# Patient Record
Sex: Female | Born: 1950 | Race: White | Hispanic: No | Marital: Married | Smoking: Former smoker
Health system: Southern US, Community
[De-identification: ages and names within clinical notes are randomized; demographics above are authoritative.]

## PROBLEM LIST (undated history)

## (undated) DIAGNOSIS — J189 Pneumonia, unspecified organism: Secondary | ICD-10-CM

## (undated) DIAGNOSIS — H8109 Meniere's disease, unspecified ear: Secondary | ICD-10-CM

## (undated) DIAGNOSIS — C801 Malignant (primary) neoplasm, unspecified: Secondary | ICD-10-CM

## (undated) DIAGNOSIS — E7800 Pure hypercholesterolemia, unspecified: Secondary | ICD-10-CM

## (undated) DIAGNOSIS — J45909 Unspecified asthma, uncomplicated: Secondary | ICD-10-CM

## (undated) DIAGNOSIS — I10 Essential (primary) hypertension: Secondary | ICD-10-CM

## (undated) DIAGNOSIS — Z96651 Presence of right artificial knee joint: Secondary | ICD-10-CM

## (undated) DIAGNOSIS — M1990 Unspecified osteoarthritis, unspecified site: Secondary | ICD-10-CM

## (undated) DIAGNOSIS — E039 Hypothyroidism, unspecified: Secondary | ICD-10-CM

## (undated) DIAGNOSIS — I6529 Occlusion and stenosis of unspecified carotid artery: Secondary | ICD-10-CM

## (undated) DIAGNOSIS — I739 Peripheral vascular disease, unspecified: Secondary | ICD-10-CM

## (undated) HISTORY — PX: COLONOSCOPY: SHX174

## (undated) HISTORY — DX: Unspecified asthma, uncomplicated: J45.909

## (undated) HISTORY — DX: Hypothyroidism, unspecified: E03.9

## (undated) HISTORY — DX: Essential (primary) hypertension: I10

## (undated) HISTORY — DX: Unspecified osteoarthritis, unspecified site: M19.90

## (undated) HISTORY — DX: Pure hypercholesterolemia, unspecified: E78.00

---
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
## 1980-01-27 HISTORY — PX: APPENDECTOMY: SHX54

## 1981-01-26 HISTORY — PX: TUBAL LIGATION: SHX77

## 1996-01-27 HISTORY — PX: SIGMOID RESECTION / RECTOPEXY: SUR1294

## 1998-01-26 HISTORY — PX: VAGINAL HYSTERECTOMY: SUR661

## 2004-07-16 ENCOUNTER — Ambulatory Visit: Payer: Self-pay | Admitting: Obstetrics and Gynecology

## 2005-09-08 ENCOUNTER — Ambulatory Visit: Payer: Self-pay | Admitting: Obstetrics and Gynecology

## 2006-05-06 ENCOUNTER — Ambulatory Visit: Payer: Self-pay | Admitting: Family Medicine

## 2006-12-16 ENCOUNTER — Ambulatory Visit: Payer: Self-pay | Admitting: Family Medicine

## 2007-12-20 ENCOUNTER — Ambulatory Visit: Payer: Self-pay | Admitting: Family Medicine

## 2008-10-18 ENCOUNTER — Emergency Department: Payer: Self-pay | Admitting: Emergency Medicine

## 2009-08-01 ENCOUNTER — Ambulatory Visit: Payer: Self-pay | Admitting: Unknown Physician Specialty

## 2009-08-02 LAB — PATHOLOGY REPORT

## 2010-08-27 ENCOUNTER — Ambulatory Visit: Payer: Self-pay | Admitting: Family Medicine

## 2010-09-21 ENCOUNTER — Ambulatory Visit: Payer: Self-pay | Admitting: Family Medicine

## 2011-09-09 ENCOUNTER — Ambulatory Visit: Payer: Self-pay | Admitting: Family Medicine

## 2012-10-25 ENCOUNTER — Ambulatory Visit: Payer: Self-pay | Admitting: Family Medicine

## 2012-10-27 ENCOUNTER — Emergency Department: Payer: Self-pay | Admitting: Emergency Medicine

## 2012-10-27 LAB — COMPREHENSIVE METABOLIC PANEL
Albumin: 3.9 g/dL (ref 3.4–5.0)
Anion Gap: 10 (ref 7–16)
BUN: 20 mg/dL — ABNORMAL HIGH (ref 7–18)
Bilirubin,Total: 0.3 mg/dL (ref 0.2–1.0)
Chloride: 107 mmol/L (ref 98–107)
Co2: 22 mmol/L (ref 21–32)
Creatinine: 0.7 mg/dL (ref 0.60–1.30)
EGFR (African American): >60
EGFR (Non-African Amer.): >60
Glucose: 99 mg/dL (ref 65–99)
Potassium: 3.6 mmol/L (ref 3.5–5.1)
Sodium: 139 mmol/L (ref 136–145)
Total Protein: 7.3 g/dL (ref 6.4–8.2)

## 2012-10-27 LAB — PROTIME-INR: INR: 0.9

## 2012-10-27 LAB — CBC
HCT: 40.3 % (ref 35.0–47.0)
MCH: 30 pg (ref 26.0–34.0)
MCHC: 34.4 g/dL (ref 32.0–36.0)
MCV: 87 fL (ref 80–100)
Platelet: 258 10*3/uL (ref 150–440)
RBC: 4.62 10*6/uL (ref 3.80–5.20)
WBC: 7.6 10*3/uL (ref 3.6–11.0)

## 2012-10-27 LAB — CK TOTAL AND CKMB (NOT AT ARMC): CK-MB: 1 ng/mL (ref 0.5–3.6)

## 2012-10-27 LAB — TSH: Thyroid Stimulating Horm: 5.04 u[IU]/mL — ABNORMAL HIGH

## 2012-10-27 LAB — APTT: Activated PTT: 35.5 secs (ref 23.6–35.9)

## 2012-10-27 LAB — T4, FREE: Free Thyroxine: 1.1 ng/dL (ref 0.76–1.46)

## 2013-11-15 DIAGNOSIS — J4520 Mild intermittent asthma, uncomplicated: Secondary | ICD-10-CM | POA: Insufficient documentation

## 2013-11-15 DIAGNOSIS — J4531 Mild persistent asthma with (acute) exacerbation: Secondary | ICD-10-CM | POA: Insufficient documentation

## 2013-11-22 ENCOUNTER — Ambulatory Visit: Payer: Self-pay | Admitting: Family Medicine

## 2015-04-03 ENCOUNTER — Other Ambulatory Visit: Payer: Self-pay | Admitting: Family Medicine

## 2015-04-03 DIAGNOSIS — Z1231 Encounter for screening mammogram for malignant neoplasm of breast: Secondary | ICD-10-CM

## 2015-04-17 ENCOUNTER — Ambulatory Visit
Admission: RE | Admit: 2015-04-17 | Discharge: 2015-04-17 | Disposition: A | Discharge status: Home / Self Care | Payer: BC Managed Care – PPO | Source: Ambulatory Visit | Attending: Family Medicine | Admitting: Family Medicine

## 2015-04-17 DIAGNOSIS — Z1231 Encounter for screening mammogram for malignant neoplasm of breast: Secondary | ICD-10-CM | POA: Insufficient documentation

## 2016-03-20 ENCOUNTER — Other Ambulatory Visit: Payer: Self-pay | Admitting: Family Medicine

## 2016-03-20 DIAGNOSIS — Z1231 Encounter for screening mammogram for malignant neoplasm of breast: Secondary | ICD-10-CM

## 2016-04-17 ENCOUNTER — Ambulatory Visit: Payer: BC Managed Care – PPO

## 2016-05-08 ENCOUNTER — Ambulatory Visit
Admission: RE | Admit: 2016-05-08 | Discharge: 2016-05-08 | Disposition: A | Discharge status: Home / Self Care | Payer: BC Managed Care – PPO | Source: Ambulatory Visit | Attending: Family Medicine | Admitting: Family Medicine

## 2016-05-08 DIAGNOSIS — Z1231 Encounter for screening mammogram for malignant neoplasm of breast: Secondary | ICD-10-CM

## 2016-08-30 DIAGNOSIS — M1711 Unilateral primary osteoarthritis, right knee: Secondary | ICD-10-CM | POA: Insufficient documentation

## 2017-05-03 ENCOUNTER — Other Ambulatory Visit: Payer: Self-pay | Admitting: Family Medicine

## 2017-05-03 DIAGNOSIS — Z1231 Encounter for screening mammogram for malignant neoplasm of breast: Secondary | ICD-10-CM

## 2017-05-19 ENCOUNTER — Ambulatory Visit: Payer: Self-pay | Admitting: Urology

## 2017-05-26 ENCOUNTER — Ambulatory Visit
Admission: RE | Admit: 2017-05-26 | Discharge: 2017-05-26 | Disposition: A | Discharge status: Home / Self Care | Payer: BC Managed Care – PPO | Source: Ambulatory Visit | Attending: Family Medicine | Admitting: Family Medicine

## 2017-05-26 DIAGNOSIS — Z1231 Encounter for screening mammogram for malignant neoplasm of breast: Secondary | ICD-10-CM

## 2017-05-28 ENCOUNTER — Other Ambulatory Visit: Payer: Self-pay | Admitting: Family Medicine

## 2017-05-28 DIAGNOSIS — R928 Other abnormal and inconclusive findings on diagnostic imaging of breast: Secondary | ICD-10-CM

## 2017-05-28 DIAGNOSIS — N6320 Unspecified lump in the left breast, unspecified quadrant: Secondary | ICD-10-CM

## 2017-05-31 ENCOUNTER — Ambulatory Visit: Payer: Self-pay | Admitting: Urology

## 2017-06-08 ENCOUNTER — Ambulatory Visit
Admission: RE | Admit: 2017-06-08 | Discharge: 2017-06-08 | Disposition: A | Discharge status: Home / Self Care | Payer: BC Managed Care – PPO | Source: Ambulatory Visit | Attending: Family Medicine | Admitting: Family Medicine

## 2017-06-08 DIAGNOSIS — N6320 Unspecified lump in the left breast, unspecified quadrant: Secondary | ICD-10-CM

## 2017-06-08 DIAGNOSIS — R928 Other abnormal and inconclusive findings on diagnostic imaging of breast: Secondary | ICD-10-CM | POA: Insufficient documentation

## 2017-06-22 DIAGNOSIS — E039 Hypothyroidism, unspecified: Secondary | ICD-10-CM | POA: Insufficient documentation

## 2017-06-22 DIAGNOSIS — I10 Essential (primary) hypertension: Secondary | ICD-10-CM | POA: Insufficient documentation

## 2017-06-22 DIAGNOSIS — E782 Mixed hyperlipidemia: Secondary | ICD-10-CM | POA: Insufficient documentation

## 2017-06-22 DIAGNOSIS — J309 Allergic rhinitis, unspecified: Secondary | ICD-10-CM | POA: Insufficient documentation

## 2017-06-22 NOTE — Progress Notes (Addendum)
06/23/2017 4:05 PM   Lori Kidd 1950/08/14 [id number]  Referring provider: Dion Body, MD [place] [facility] [place], [place] [address]  Chief Complaint  Patient presents with  . Recurrent UTI    HPI: Patient is a 67 -year-old Caucasian female who is referred to [place] by, Dr. Netty Starring, for recurrent urinary tract infections.  Patient states that she has had three urinary tract infections over the last year.   Reviewing her records,  she has had four documented UTI's over the nine months.       Her symptoms with a urinary tract infection consist of "that particular pain" with UTI's and happens quickly with low grade fevers.  She had blood in the urine with her first infection.    She denies current dysuria, gross hematuria, suprapubic pain, back pain, abdominal pain or flank pain.      She states she had a "bladder tacking" with her hysterectomy in 2000 with Dr. Ouida Sills.  She may have had a spontaneous passage of a stone approximately ten years ago.  She states she felt a sharp intense pain in the right lower quadrant that caused her to vomit.  She states she went to the ED and was told she had sand in her kidneys.  She denies any trauma to her bladder or kidneys.     Patient states that she also had a sigmoid resection back in 1998 which resulted in a lot of scar tissue.  When the hysterectomy was performed, the right adnexa was unable to be removed due to this tissue.  She is sexually active.  She has noted a correlation with her urinary tract infections and sexual intercourse.   She does not engage in anal sex.  She is voiding before and after sex.   She is postmenopausal.  She has a sister with breast cancer.    She denies constipation and/or diarrhea.  She does engage in good perineal hygiene. She does take tub baths.   She has incontinence.  Both SUI and UI.  She is using light incontinence pads.  She started wearing the  incontinence pads approximately 9 months ago feels there is a correlation between the wearing of the incontinence pads and recurrent urinary tract infections.  She is not having pain with bladder filling.    She has not had any recent imaging studies.    She is drinking "right much" of water daily.   Rare soda intake,  No juices.  Unsweetened teas.  3 to 4 drinks of alcohol a week.  Her PVR today is 0 mL.  She was a social smoker in college.    Her baseline urinary symptoms consist of urinary frequency resulting in visiting the restroom every hour while home.  She states that this is been going on for a while.  She also has an urgency to urinate at times.  Sometimes the urge is intense and causes urinary leakage.  She is also getting up 1-2 times a night.  She also states that she experiences the urge to urinate after she brushes her teeth at night.  Reviewed referral notes - see urine cultures in lab section  PMH: Past Medical History:  Diagnosis Date  . Arthritis   . Asthma   . High blood pressure   . High cholesterol   . Hypothyroidism     Surgical History: Past Surgical History:  Procedure Laterality Date  . APPENDECTOMY  1982  . SIGMOID RESECTION / RECTOPEXY  [place]  . VAGINAL HYSTERECTOMY  2000    Home Medications:  Allergies as of 06/23/2017      Reactions   Ace Inhibitors Cough   Penicillins Rash   Redness      Medication List        Accurate as of 06/23/17  4:05 PM. Always use your most recent med list.          aspirin EC 81 MG tablet Take 81 mg by mouth daily.   CLARITIN 10 MG Caps Generic drug:  Loratadine Take 1 capsule by mouth daily.   levothyroxine 112 MCG tablet Commonly known as:  SYNTHROID, LEVOTHROID Take 1 tablet by mouth daily.   naproxen sodium 220 MG tablet Commonly known as:  ALEVE Take 220 mg by mouth.   Omega-3 1000 MG Caps Take 2 capsules by mouth daily.   pravastatin 40 MG tablet Commonly known as:   PRAVACHOL Take 1 tablet by mouth daily.   triamterene-hydrochlorothiazide 37.5-25 MG tablet Commonly known as:  MAXZIDE-25 Take 1 tablet by mouth daily.   TYLENOL 8 HOUR PO Take 1 capsule by mouth daily.       Allergies:  Allergies  Allergen Reactions  . Ace Inhibitors Cough  . Penicillins Rash    Redness    Family History: Family History  Problem Relation Age of Onset  . Breast cancer Sister        early 6's  . Pneumonia Mother   . Pneumonia Father     Social History:  has no tobacco, alcohol, and drug history on file.  ROS: UROLOGY Frequent Urination?: Yes Hard to postpone urination?: Yes Burning/pain with urination?: No Get up at night to urinate?: Yes Leakage of urine?: Yes Urine stream starts and stops?: No Trouble starting stream?: No Do you have to strain to urinate?: No Blood in urine?: No Urinary tract infection?: Yes Sexually transmitted disease?: No Injury to kidneys or bladder?: No Painful intercourse?: No Weak stream?: No Currently pregnant?: No Vaginal bleeding?: No Last menstrual period?: n  Gastrointestinal Nausea?: No Vomiting?: No Indigestion/heartburn?: No Diarrhea?: No Constipation?: No  Constitutional Fever: No Night sweats?: Yes Weight loss?: No Fatigue?: Yes  Skin Skin rash/lesions?: No Itching?: No  Eyes Blurred vision?: No Double vision?: No  Ears/Nose/Throat Sore throat?: No Sinus problems?: No  Hematologic/Lymphatic Swollen glands?: No Easy bruising?: Yes  Cardiovascular Leg swelling?: No Chest pain?: No  Respiratory Cough?: Yes Shortness of breath?: No  Endocrine Excessive thirst?: No  Musculoskeletal Back pain?: No Joint pain?: Yes  Neurological Headaches?: No Dizziness?: No  Psychologic Depression?: No Anxiety?: No  Physical Exam: BP 123/80   Pulse 81   Ht 5' 3.5" (1.613 m)   Wt 177 lb 1.6 oz (80.3 kg)   BMI 30.88 kg/m   Constitutional:  Well nourished. Alert and oriented, No  acute distress. HEENT: [place] AT, moist mucus membranes.  Trachea midline, no masses. Cardiovascular: No clubbing, cyanosis, or edema. Respiratory: Normal respiratory effort, no increased work of breathing. GI: Abdomen is soft, non tender, non distended, no abdominal masses. Liver and spleen not palpable.  No hernias appreciated.  Stool sample for occult testing is not indicated.   GU: No CVA tenderness.  No bladder fullness or masses.  Atrophic external genitalia, normal pubic hair distribution, no lesions.  Normal urethral meatus, no lesions, no prolapse, no discharge.   No urethral masses, tenderness and/or tenderness. No bladder fullness, tenderness or masses. Pale vagina mucosa, poor estrogen effect, no  discharge, no lesions, poor pelvic support, no cystocele noted.  Rectocele noted.  Cervix and uterus are surgically absent.  Left adnexa is surgically absent.  Right adnexa is tender.   Anus and perineum are without rashes or lesions.    Skin: No rashes, bruises or suspicious lesions. Lymph: No cervical or inguinal adenopathy. Neurologic: Grossly intact, no focal deficits, moving all 4 extremities. Psychiatric: Normal mood and affect.  Laboratory Data: Lab Results  Component Value Date   WBC 7.6 10/27/2012   HGB 13.9 10/27/2012   HCT 40.3 10/27/2012   MCV 87 10/27/2012   PLT 258 10/27/2012    Lab Results  Component Value Date   CREATININE 0.70 10/27/2012    No results found for: PSA  No results found for: TESTOSTERONE  No results found for: HGBA1C  Lab Results  Component Value Date   TSH 5.04 (H) 10/27/2012    No results found for: CHOL, HDL, CHOLHDL, VLDL, LDLCALC  Lab Results  Component Value Date   AST 20 10/27/2012   Lab Results  Component Value Date   ALT 25 10/27/2012   No components found for: ALKALINEPHOPHATASE No components found for: BILIRUBINTOTAL  No results found for: ESTRADIOL  Urinalysis No results found for: COLORURINE, APPEARANCEUR, LABSPEC,  PHURINE, GLUCOSEU, HGBUR, BILIRUBINUR, KETONESUR, PROTEINUR, UROBILINOGEN, NITRITE, LEUKOCYTESUR  Urine cultures Pan-sensitive E. Coli on 07/02/2016 Pan-sensitive E. Coli on 07/18/2016 Pan-sensitive E. Coli on 12/02/2016 Ampicillin resistant E. Coli on 04/29/2017  I have reviewed the labs.   I have independently reviewed the films.    Pertinent Imaging Results for Lori Kidd, Lori Kidd (MRN [id number]) as of 06/23/2017 10:15  Ref. Range 06/23/2017 10:06  Scan Result Unknown 0    Assessment & Plan:    1. Recurrent UTI's  - criteria for recurrent UTI has been met with 2 or more infections in 6 months or 3 or greater infections in one year   - patient has good water intake  - patient is instructed to take probiotics (yogurt, oral pills or vaginal suppositories), take cranberry pills or drink the juice and Vitamin C 1,000 mg daily to acidify the urine   - avoid soaking in tubs and wipe front to back after urinating   - benefit from core strengthening exercises has been seen.  We can refer to PT if they desire -she would like a referral to PT  -Asked the patient to contact us for symptoms of urinary tract infections  2. Vaginal atrophy Discussed the role of vaginal estrogen creams in the preventing of recurrent urinary tract infections and while the risk is minimal, patient would defer vaginal estrogen cream at this time due to her sister's history of breast cancer and her history of an abnormal mammogram -May consider this treatment option in the future if conservative measures are not effective in controlling recurrent UTIs  3. Right adnexal tenderness Offered patient referred to gynecology or for [place] to obtain a pelvic ultrasound, she would like [place] to order a pelvic ultrasound and be referred based on those findings                                         4.  Pelvic floor relaxation Explained the role of pelvic muscles and their contribution to stability and how undergoing pelvic  surgery or childbirth can weaken these muscles Offered patient a referral to physical therapy for further evaluation and  strengthening of the pelvic floor RTC once completes PT  5. Urgency Discussed OAB agents and their side effect profiles, patient will like to defer any medical treatment at this time until she completes her physical therapy Will reassess when she returns in 6 weeks   Return in about 6 weeks (around 08/04/2017) for follow up after PT.  These notes generated with voice recognition software. I apologize for typographical errors.  Zara Council, PA-C  [facility] [address]  [place], [place] [address] [phone]

## 2017-06-23 ENCOUNTER — Encounter: Payer: Self-pay | Admitting: Urology

## 2017-06-23 ENCOUNTER — Ambulatory Visit: Payer: BC Managed Care – PPO | Admitting: Urology

## 2017-06-23 VITALS — BP 123/80 | HR 81 | Ht 63.5 in | Wt 177.1 lb

## 2017-06-23 DIAGNOSIS — R3915 Urgency of urination: Secondary | ICD-10-CM

## 2017-06-23 DIAGNOSIS — N390 Urinary tract infection, site not specified: Secondary | ICD-10-CM

## 2017-06-23 DIAGNOSIS — R102 Pelvic and perineal pain: Secondary | ICD-10-CM | POA: Diagnosis not present

## 2017-06-23 DIAGNOSIS — N8189 Other female genital prolapse: Secondary | ICD-10-CM | POA: Diagnosis not present

## 2017-06-23 DIAGNOSIS — N952 Postmenopausal atrophic vaginitis: Secondary | ICD-10-CM

## 2017-06-23 LAB — BLADDER SCAN AMB NON-IMAGING: SCAN RESULT: 0

## 2017-06-23 NOTE — Patient Instructions (Signed)

## 2017-06-30 ENCOUNTER — Ambulatory Visit: Payer: BC Managed Care – PPO | Admitting: Physical Therapy

## 2017-07-01 ENCOUNTER — Ambulatory Visit: Payer: BC Managed Care – PPO

## 2017-07-01 ENCOUNTER — Ambulatory Visit
Admission: RE | Admit: 2017-07-01 | Discharge: 2017-07-01 | Disposition: A | Discharge status: Home / Self Care | Payer: BC Managed Care – PPO | Source: Ambulatory Visit | Attending: Urology | Admitting: Urology

## 2017-07-01 DIAGNOSIS — R102 Pelvic and perineal pain: Secondary | ICD-10-CM

## 2017-07-02 ENCOUNTER — Telehealth: Payer: Self-pay

## 2017-07-02 NOTE — Telephone Encounter (Signed)
-----   Message from Harle BattiestShannon A McGowan, PA-C sent at 07/02/2017  7:37 AM EDT ----- Please let Mrs. Perlie GoldRussell know that her pelvic ultrasound was normal.

## 2017-07-02 NOTE — Telephone Encounter (Signed)
Patient notified

## 2017-07-07 ENCOUNTER — Ambulatory Visit: Payer: BC Managed Care – PPO | Attending: Urology | Admitting: Physical Therapy

## 2017-07-07 ENCOUNTER — Encounter: Payer: BC Managed Care – PPO | Admitting: Physical Therapy

## 2017-07-07 ENCOUNTER — Encounter: Payer: Self-pay | Admitting: Physical Therapy

## 2017-07-07 ENCOUNTER — Other Ambulatory Visit: Payer: Self-pay

## 2017-07-07 DIAGNOSIS — R29898 Other symptoms and signs involving the musculoskeletal system: Secondary | ICD-10-CM | POA: Insufficient documentation

## 2017-07-07 DIAGNOSIS — R278 Other lack of coordination: Secondary | ICD-10-CM | POA: Insufficient documentation

## 2017-07-07 DIAGNOSIS — M2170 Unequal limb length (acquired), unspecified site: Secondary | ICD-10-CM

## 2017-07-07 DIAGNOSIS — M791 Myalgia, unspecified site: Secondary | ICD-10-CM

## 2017-07-07 NOTE — Patient Instructions (Addendum)
  Avoid straining pelvic floor, abdominal muscles , spine  Use log rolling technique instead of getting out of bed with your neck or the sit-up   Log rolling out of .bed  L  arm overhead  Raise hips and scoot hips to R   Drop knees to L,  scooting L shoulder back to get completely on your L side so your shoulders, hips, and knees point to the L    Then breathe as you drop feet off bed and prop onto L elbow and  use both hands to push yourself      __________  Wear lift in shoe on R.  Wear a normal shoe with good sole support ( avoid flipflops)   ___________  6 spinal directions  Arm swings,  side bend Minisquat with wide feet, chest lifts   6 reps   X 3 day   ___  Where as this one is a non latex one shoe lift https://www.theinsolestore.com/clearly-adjustable-heel-lift.html

## 2017-07-08 NOTE — Therapy (Signed)
[place]  [facility] [address] [place],[facility] [place], [address] Phone: [phone]   Fax:  [phone]  Physical Therapy Evaluation  Patient Details  Name: Lori HighmanJennie Jenkins Kidd MRN: [id number] Date of Birth: 12/20/1950 Referring Provider: Michiel CowboyShannon McGowan   Encounter Date: 07/07/2017  PT End of Session - 07/07/17 1415    Visit Number  1    Number of Visits  12    Date for PT Re-Evaluation  09/29/17    PT Start Time  1305    PT Stop Time  1403    PT Time Calculation (min)  58 min       Past Medical History:  Diagnosis Date  . Arthritis   . Asthma   . High blood pressure   . High cholesterol   . Hypothyroidism     Past Surgical History:  Procedure Laterality Date  . APPENDECTOMY  1982  . SIGMOID RESECTION / RECTOPEXY  1998  . TONSILLECTOMY  1962  . TUBAL LIGATION  1983  . VAGINAL HYSTERECTOMY  2000    There were no vitals filed for this visit.   Subjective Assessment - 07/08/17 1937    Subjective  1) Pt has experienced urinary leakage in her 30s. Within the past 2 years, leakage became hard to control. Pt is wearing 2 pads per day and she is not able to hike with family , travel for > 1 hours, nor shop without needing to get to a bathroom .  Pt also has leakage with coughing, sneezing, laughing.  Hx of asthma for 15 years and coughing makes leakage worse.    2) Urge  and frequency: Trips to the bathroom  every hours 1 x during day,  2 x night . First trip at night to the toilet occurs at the same time every night and the second time occurs 1 hours later due urge.  Pt has the urge to go to the bathroom once she arriave home with the key in the door. Pt sometimes has leakage before making it to the bathroom.  Pt reports she has not had a sleep study to screen for OSA . Pt snores.   3) difficulty with controlling gas. Denied bowel incontinence.   Denied LBP, sexual dysfunction. Pt currently has R knee pain and will be  undergoing TKA 2020.  Physical activities: Crunches on the machine at the gym, lifting 18 lbs granddtr. Pt cares for her granddtr 5 x week.  Pt enjoys yoga.    4) R knee pain:  Currently pt stopped walking, hiking 6-7 miles, shopping, stairclimbing  due to the R knee OA. Pt reports she has scoliosis and she didnot know about it until her first child.    5) Recurrent UTI: Pt had 3 x across 1 year . Pt had to stop going to the pool which she loved for strengthening her body due to UTI.  Pt currently does not have a UTI.       Pertinent History   Pt had a hysterectomy 15 years ago to remove benign tumors on her B ovaries but the left ovary was removed because there was increased scar adhesions.  Pt had sigmoid removed in 1999 ( 20 years ago) and tubal ligation ( 1324( 1983).  Pt recently had a US done on the R ovary and findings were negative.  Additional contributing factors: Hx of asthma for 15 years and coughing makes leakage worse.  Hx of HBP.  2 vaginal deliveries, epsiotomies. 10  lb babies. use of forceps in delivery.  Scoliosis             [facility] PT Assessment - 07/08/17 1937      Assessment   Medical Diagnosis  pelvic floor relaxation    Referring Provider  Michiel Cowboy      Precautions   Precautions  None      Restrictions   Weight Bearing Restrictions  No      Prior Function   Level of Independence  Independent      Observation/Other Assessments   Observations  L shoulder lower than R  medial malleoli-AIIS86 cm R, 87 cm L, R iliac crest higher,       Posture/Postural Control   Posture/Postural Control  -- limited diaphragmatic excursion      AROM   Overall AROM Comments  sidebend  , sidebend R 53 cm digitt II to floor  ( pre shoe lift) and 51 cm (post-shoe lift) , 51 cm L  sidebend      Palpation   Palpation comment  signficant scar restrictions longitinal scar pubic to umbilicus       Bed Mobility   Bed Mobility  -- crunch method                Objective  measurements completed on examination: See above findings.    Pelvic Floor Special Questions - 07/08/17 1937    Pelvic Floor Internal Exam  plan to assess at upcoming sessions       [facility] Adult PT Treatment/Exercise - 07/08/17 1936      Bed Mobility   Bed Mobility  -- crunch method      Posture/Postural Control   Posture/Postural Control  -- limited diaphragmatic excursion                  PT Long Term Goals - 07/07/17 1315      PT LONG TERM GOAL #1   Title  Pt will be able to shop in store for to 1 hours without needing to get to the toilet     Time  12    Period  Weeks    Status  New    [employer] Date  09/29/17      PT LONG TERM GOAL #2   Title  Pt will decrease her trips to the toilet from 1x/ hr to 1x/ every 2 hours in order to work     Time  8    Period  Weeks    Status  New    [employer] Date  09/01/17      PT LONG TERM GOAL #3   Title  Pt will decrease her PFDI score from 68% to <50 % in order to restore pelvic function    Time  12    Period  Weeks    Status  New    [employer] Date  09/29/17      PT LONG TERM GOAL #4   Title  Pt will decrease pdas from 2x day to < 1 x day in order to QOL     Time  6    Period  Weeks    Status  New    [employer] Date  08/18/17      PT LONG TERM GOAL #5   Title  Pt will demo proper body mechanics to minimzie strain on her pelvic floor and optimize her intraabdominal pressure system in order to perform ADLs and lift her granddtr    Time  4    Period  Weeks    Status  New    [employer] Date  08/04/17      Additional Long Term Goals   Additional Long Term Goals  Yes      PT LONG TERM GOAL #6   Title  Pt will report no recurrent UTI across 3 months in order to improve QOL    Time  12    Period  Weeks    Status  New    [employer] Date  09/30/17             Plan - 07/08/17 1937    Clinical Impression Statement  Pt is a 67 yo female who complains of  pelvic dysfunctions that include stress and urge urinary  incontinence,  urgency, frequency, flatulence, recurrent UTIs, and nocturia. Pt also reported R knee  pain. These deficits impact her QOL and ADLs. Pt's clinical presentation  includes leg length discrepency 2/2 scoliosis, limited spinal ROM, gait deviations,  severe scar restrictions over abdomen, dyscoodination of pelvic and deep  core mm, and poor body mechanics which place load on her deep core system.  Following Tx today, pt showed improved body mechanics. R shoe lift was  provided which in turn lead to a more equally aligned iliac crest crest, increased R spinal side flexion ROM, gait, and  decreased pain in R knee. Plan to decrease abdominal scar restrictions  with manual Tx, assess pelvic floor, and customize Tx to minimize worsening of scoliosis to help pt meet her goals.  Pt would benefit from an regional interdependent approach to yield longer lasting benefits.  Pt would also benefit from a sleep study to screen for OSA given her risk factors of OSA (nocturia, report of snoring, respiratory disorder -asthma). Pt was provided research articles on the association between nocturia and OSA.        History and Personal Factors relevant to plan of care:  Surgeries over abdomen. Pt had a hysterectomy 15 years ago to remove benign tumors on her B ovaries but the left ovary was removed because there was increased scar adhesions.  Pt had sigmoid removed in 1999 ( 20 years ago) and tubal ligation ( 6045).  Pt recently had a US done on the R ovary and findings were negative.  Additional contributing factors: Hx of asthma for 15 years and coughing makes leakage worse.  Hx o f HBP.  2 vaginal deliveries, epsiotomies. 10 lb babies. use of forceps in delivery.  Scoliosis    Clinical Presentation  Evolving    Clinical Decision Making  High    Rehab Potential  Good    PT Frequency  1x / week    PT Duration  12 weeks    PT Treatment/Interventions  Therapeutic exercise;Electrical Stimulation;Neuromuscular  re-education;Moist Heat;Manual techniques;Therapeutic activities;Patient/family education;Aquatic Therapy;Stair training;Gait training;Scar mobilization    Consulted and Agree with Plan of Care  Patient       Patient will benefit from skilled therapeutic intervention in order to improve the following deficits and impairments:  Improper body mechanics, Pain, Abnormal gait, Decreased coordination, Decreased mobility, Decreased scar mobility, Postural dysfunction, Increased muscle spasms, Decreased endurance, Decreased strength, Decreased balance, Impaired flexibility, Decreased range of motion, Increased fascial restricitons  Visit Diagnosis: Other symptoms and signs involving the musculoskeletal system  Myalgia  Other lack of coordination  Unequal leg length     Problem List Patient Active Problem List   Diagnosis Date Noted  . Acquired hypothyroidism 06/22/2017  . Allergic  rhinitis 06/22/2017  . Essential hypertension 06/22/2017  . Mixed hyperlipidemia 06/22/2017  . Primary osteoarthritis of right knee 08/30/2016  . Mild persistent asthma with acute exacerbation 11/15/2013    Lori Kidd ,PT, DPT, E-RYT  07/08/2017, 7:51 PM  [place] [facility] SERVICES [address] [place], [place], [address] Phone: [phone]   Fax:  [phone]  Name: Lori Kidd MRN: [id number] Date of Birth: 1950/04/10

## 2017-07-14 ENCOUNTER — Encounter: Payer: BC Managed Care – PPO | Admitting: Physical Therapy

## 2017-07-14 ENCOUNTER — Ambulatory Visit: Payer: BC Managed Care – PPO | Admitting: Physical Therapy

## 2017-07-14 ENCOUNTER — Encounter: Payer: Self-pay | Admitting: Physical Therapy

## 2017-07-14 DIAGNOSIS — M2170 Unequal limb length (acquired), unspecified site: Secondary | ICD-10-CM

## 2017-07-14 DIAGNOSIS — R29898 Other symptoms and signs involving the musculoskeletal system: Secondary | ICD-10-CM | POA: Diagnosis not present

## 2017-07-14 DIAGNOSIS — M791 Myalgia, unspecified site: Secondary | ICD-10-CM

## 2017-07-14 DIAGNOSIS — R278 Other lack of coordination: Secondary | ICD-10-CM

## 2017-07-14 NOTE — Therapy (Signed)
[place] [facility] MAIN [facility] SERVICES [address] [place], [place], [address] Phone: [phone]   Fax:  [phone]  Physical Therapy Treatment  Patient Details  Name: Lori Kidd MRN: [id number] Date of Birth: 11-07-1950 Referring Provider: Michiel Cowboy   Encounter Date: 07/14/2017  PT End of Session - 07/14/17 2339    Visit Number  2    Number of Visits  12    Date for PT Re-Evaluation  09/29/17    PT Start Time  1115    PT Stop Time  1216    PT Time Calculation (min)  61 min    Activity Tolerance  Patient tolerated treatment well;No increased pain    Behavior During Therapy  WFL for tasks assessed/performed       Past Medical History:  Diagnosis Date  . Arthritis   . Asthma   . High blood pressure   . High cholesterol   . Hypothyroidism     Past Surgical History:  Procedure Laterality Date  . APPENDECTOMY  1982  . SIGMOID RESECTION / RECTOPEXY  1998  . TONSILLECTOMY  1962  . TUBAL LIGATION  1983  . VAGINAL HYSTERECTOMY  2000    There were no vitals filed for this visit.  Subjective Assessment - 07/14/17 1118    Subjective  Pt reported she will be ordering a vinyl shoe lift. She feels better overall with some positive change. She feels more balanced with picking up the baby.     Pertinent History   Pt had a hysterectomy 15 years ago to remove benign tumors on her B ovaries but the left ovary was removed because there was increased scar adhesions.  Pt had sigmoid removed in 1999 ( 20 years ago) and tubal ligation ( 6962).  Pt recently had a US done on the R ovary and findings were negative.  Additional contributing factors: Hx of asthma for 15 years and coughing makes leakage worse.  Hx of HBP.  2 vaginal deliveries, epsiotomies. 10 lb babies. use of forceps in delivery.  Scoliosis             [facility] PT Assessment - 07/14/17 2331      Floor to Stand   Comments  with poor alignment at knees and narrow BOS, with  breathholding       Other:   Other/ Comments  simulated lifting of granddtr , poor form                 Pelvic Floor Special Questions - 07/14/17 2332    Pelvic Floor Internal Exam  pt consented verbally without contraindications     Exam Type  Vaginal    Palpation  increased scar restrictions at 5-7 o clock, obt int L, and R ischiocavernosus/ behind pubic symphysis     Strength  fair squeeze, definite lift 3rd layer initially, post Tx: more sequential activation     Strength # of reps  8    Strength # of seconds  1        [facility] Adult PT Treatment/Exercise - 07/14/17 2335      Therapeutic Activites    Therapeutic Activities  -- see pt instructions.modified floor<> stand t/f due to [facility]       Neuro Re-ed    Neuro Re-ed Details   see pt instructions       Manual Therapy   Internal Pelvic Floor  fascial release at probelm areas indicated in assessment  PT Long Term Goals - 07/07/17 1315      PT LONG TERM GOAL #1   Title  Pt will be able to shop in store for to 1 hours without needing to get to the toilet     Time  12    Period  Weeks    Status  New    [employer] Date  09/29/17      PT LONG TERM GOAL #2   Title  Pt will decrease her trips to the toilet from 1x/ hr to 1x/ every 2 hours in order to work     Time  8    Period  Weeks    Status  New    [employer] Date  09/01/17      PT LONG TERM GOAL #3   Title  Pt will decrease her PFDI score from 68% to <50 % in order to restore pelvic function    Time  12    Period  Weeks    Status  New    [employer] Date  09/29/17      PT LONG TERM GOAL #4   Title  Pt will decrease pdas from 2x day to < 1 x day in order to QOL     Time  6    Period  Weeks    Status  New    [employer] Date  08/18/17      PT LONG TERM GOAL #5   Title  Pt will demo proper body mechanics to minimzie strain on her pelvic floor and optimize her intraabdominal pressure system in order to perform ADLs and lift her granddtr     Time  4    Period  Weeks    Status  New    Target Date  08/04/17      Additional Long Term Goals   Additional Long Term Goals  Yes      PT LONG TERM GOAL #6   Title  Pt will report no recurrent UTI across 3 months in order to improve QOL    Time  12    Period  Weeks    Status  New    Target Date  09/30/17            Plan - 07/14/17 2339    Clinical Impression Statement  Pt required training and modifications to floor <> stand t/f and lifting granddtr.  Pt demo'd correctly after receiving cues for better alignment and less straining of pelvic floor and minimize pain / strain on R knee 2/2 OA.  Pt's intervaginal assessment showed scar restrictions. Pt tolerated manual Tx without complaints and dmeo'd improved pelvic floor coodination and strength post Tx. Plan to apply manual Tx to decrease abdominal scar next session.  Pt continues to benefit from skilled PT     Rehab Potential  Good    PT Frequency  1x / week    PT Duration  12 weeks    PT Treatment/Interventions  Therapeutic exercise;Electrical Stimulation;Neuromuscular re-education;Moist Heat;Manual techniques;Therapeutic activities;Patient/family education;Aquatic Therapy;Stair training;Gait training;Scar mobilization    Consulted and Agree with Plan of Care  Patient       Patient will benefit from skilled therapeutic intervention in order to improve the following deficits and impairments:  Improper body mechanics, Pain, Abnormal gait, Decreased coordination, Decreased mobility, Decreased scar mobility, Postural dysfunction, Increased muscle spasms, Decreased endurance, Decreased strength, Decreased balance, Impaired flexibility, Decreased range of motion, Increased fascial restricitons  Visit Diagnosis: Myalgia  Other lack of coordination  Unequal leg length  Other symptoms and signs involving the musculoskeletal system     Problem List Patient Active Problem List   Diagnosis Date Noted  . Acquired  hypothyroidism 06/22/2017  . Allergic rhinitis 06/22/2017  . Essential hypertension 06/22/2017  . Mixed hyperlipidemia 06/22/2017  . Primary osteoarthritis of right knee 08/30/2016  . Mild persistent asthma with acute exacerbation 11/15/2013    Mariane MastersYeung,Shin Yiing ,PT, DPT, E-RYT  07/14/2017, 11:43 PM  [place] [facility] [address] [place], [place], [address] Phone: [phone]   Fax:  [phone]  Name: Lori Kidd MRN: [id number] Date of Birth: 08/19/1950

## 2017-07-14 NOTE — Patient Instructions (Signed)
       Transition from standing to floor: Wide squat like you are about to pick something up from the floor --> hands on the ground (all fours)  To get up, all fours--> lifts hips in to Downward Facing Dog  and walk hands backwards to feet --> mini quat --> hands on thighs, then hips then pause to avoid (moving too quickly up/ blood rush) -->  knees glide forward and roll  Hips up instead of hinging spine up   See videos on phone   __ Lifting granddtr with   Minisquat: Scoot buttocks back slight, hinge like you are looking at your reflection on a pond  Knees behind toes,  Inhale to "smell flowers"  Exhale on the rise "like rocket"  Do not lock knees, have more weight across ballmounds of feet, toes relaxed   10 reps x 3 x day   ___  Deep core Level 1 10 reps,  Quick squeeze 10 reps  X 2 day

## 2017-07-21 ENCOUNTER — Encounter: Payer: BC Managed Care – PPO | Admitting: Physical Therapy

## 2017-07-21 ENCOUNTER — Ambulatory Visit: Payer: BC Managed Care – PPO | Admitting: Physical Therapy

## 2017-07-21 DIAGNOSIS — M2170 Unequal limb length (acquired), unspecified site: Secondary | ICD-10-CM

## 2017-07-21 DIAGNOSIS — R278 Other lack of coordination: Secondary | ICD-10-CM

## 2017-07-21 DIAGNOSIS — R29898 Other symptoms and signs involving the musculoskeletal system: Secondary | ICD-10-CM

## 2017-07-21 DIAGNOSIS — M791 Myalgia, unspecified site: Secondary | ICD-10-CM

## 2017-07-21 NOTE — Therapy (Signed)
[place] [facility] [address] [place], [place], [address] Phone: [phone]   Fax:  [phone]  Physical Therapy Treatment  Patient Details  Name: Lori HighmanJennie Jenkins Kidd MRN: [id number] Date of Birth: 10/21/1950 Referring Provider: Michiel CowboyShannon McGowan   Encounter Date: 07/21/2017  PT End of Session - 07/21/17 1152    Visit Number  3    Number of Visits  12    Date for PT Re-Evaluation  09/29/17    PT Start Time  1105    PT Stop Time  1158    PT Time Calculation (min)  53 min    Activity Tolerance  Patient tolerated treatment well;No increased pain    Behavior During Therapy  WFL for tasks assessed/performed       Past Medical History:  Diagnosis Date  . Arthritis   . Asthma   . High blood pressure   . High cholesterol   . Hypothyroidism     Past Surgical History:  Procedure Laterality Date  . APPENDECTOMY  1982  . SIGMOID RESECTION / RECTOPEXY  1998  . TONSILLECTOMY  1962  . TUBAL LIGATION  1983  . VAGINAL HYSTERECTOMY  2000    There were no vitals filed for this visit.  Subjective Assessment - 07/21/17 1141    Subjective  Pt reports she tends to go to the bathroom every 30 min. Pt was sore in the pelvic area for 3 days after last session. Pt is noticing her pelvic floor and using the lifting techniques when carrying her granddtr.     Pertinent History   Pt had a hysterectomy 15 years ago to remove benign tumors on her B ovaries but the left ovary was removed because there was increased scar adhesions.  Pt had sigmoid removed in 1999 ( 20 years ago) and tubal ligation ( 3244( 1983).  Pt recently had a US done on the R ovary and findings were negative.  Additional contributing factors: Hx of asthma for 15 years and coughing makes leakage worse.  Hx of HBP.  2 vaginal deliveries, epsiotomies. 10 lb babies. use of forceps in delivery.  Scoliosis             [facility] PT Assessment - 07/21/17 1153      Coordination   Gross  Motor Movements are Fluid and Coordinated  -- minor cue for lessl umbopelvic instability      Ambulation/Gait   Gait Comments  less limping                     [facility] Adult PT Treatment/Exercise - 07/21/17 1152      Neuro Re-ed    Neuro Re-ed Details   see pt instructions              PT Education - 07/21/17 1151    Education Details  HEP    Person(s) Educated  Patient    Methods  Explanation;Demonstration;Tactile cues;Verbal cues;Handout    Comprehension  Verbalized understanding;Returned demonstration          PT Long Term Goals - 07/07/17 1315      PT LONG TERM GOAL #1   Title  Pt will be able to shop in store for 45min to 1 hours without needing to get to the toilet     Time  12    Period  Weeks    Status  New    [employer] Date  09/29/17      PT LONG TERM  GOAL #2   Title  Pt will decrease her trips to the toilet from 1x/ hr to 1x/ every 2 hours in order to work     Time  8    Period  Weeks    Status  New    Target Date  09/01/17      PT LONG TERM GOAL #3   Title  Pt will decrease her PFDI score from 68% to <50 % in order to restore pelvic function    Time  12    Period  Weeks    Status  New    Target Date  09/29/17      PT LONG TERM GOAL #4   Title  Pt will decrease pdas from 2x day to < 1 x day in order to QOL     Time  6    Period  Weeks    Status  New    Target Date  08/18/17      PT LONG TERM GOAL #5   Title  Pt will demo proper body mechanics to minimzie strain on her pelvic floor and optimize her intraabdominal pressure system in order to perform ADLs and lift her granddtr    Time  4    Period  Weeks    Status  New    Target Date  08/04/17      Additional Long Term Goals   Additional Long Term Goals  Yes      PT LONG TERM GOAL #6   Title  Pt will report no recurrent UTI across 3 months in order to improve QOL    Time  12    Period  Weeks    Status  New    Target Date  09/30/17            Plan - 07/21/17 1158     Clinical Impression Statement Pt showed good carry over with pelvic floor coordination and improved gait with shoe lift in place. Today, pt progressed to deep core level 2 with minor cues for more postural stability. Added urge supression and mindfulness training. 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 Pt voiced understanding.  Pt was also provided pain science education in preparation for her knee surgery. Pt continues to benefit from skilled PT. Plan to add yoga to her HEP with modifications for R knee OA as pt reports she enjoyed yoga in the past.      Rehab Potential  Good    PT Frequency  1x / week    PT Duration  12 weeks    PT Treatment/Interventions  Therapeutic exercise;Electrical Stimulation;Neuromuscular re-education;Moist Heat;Manual techniques;Therapeutic activities;Patient/family education;Aquatic Therapy;Stair training;Gait training;Scar mobilization    Consulted and Agree with Plan of Care  Patient       Patient will benefit from skilled therapeutic intervention in order to improve the following deficits and impairments:  Improper body mechanics, Pain, Abnormal gait, Decreased coordination, Decreased mobility, Decreased scar mobility, Postural dysfunction, Increased muscle spasms, Decreased endurance, Decreased strength, Decreased balance, Impaired flexibility, Decreased range of motion, Increased fascial restricitons  Visit Diagnosis: Myalgia  Other lack of coordination  Unequal leg length  Other symptoms and signs involving the musculoskeletal system     Problem List Patient Active Problem List   Diagnosis Date Noted  . Acquired hypothyroidism 06/22/2017  . Allergic rhinitis 06/22/2017  . Essential hypertension 06/22/2017  .  Mixed hyperlipidemia 06/22/2017  . Primary osteoarthritis of right knee 08/30/2016  . Mild persistent asthma with acute  exacerbation 11/15/2013    Mariane Masters ,PT, DPT, E-RYT  07/21/2017, 12:04 PM  [facility] MAIN [facility] SERVICES [address] [place], [place], [address] Phone: [phone]   Fax:  [phone]  Name: Lori Kidd MRN: [id number] Date of Birth: 1950-08-28

## 2017-07-21 NOTE — Patient Instructions (Addendum)
In the morning and night: Deep core level 1 and 2   Also add in the morning: Body scan ( mindfulness )  ___________________ When you feel the urge after you have just emptied your bladder: ( remember normal schedule is 1x every 2 hours)  URGE SUPPRESION TECHNIQUES  ? These techniques are to be used to suppress those abnormally strong URGES to urinate, especially after you have already urinated < 1hr ago.  ? These steps do not have to be followed in order, and not all steps have to be used.   ? The purpose of these steps is to help you regain control of your bladder, to reduce the amount of urinary urgency, frequency, or leaking.  ? They take practice to master in controlling urgency.  Allow yourself to be okay with leaking when you first start practicing these steps. ? Practice these steps first at home, when you do not have to worry as much about leaking.  1. SIT DOWN.  Pressure on the pelvic floor inhibits the bladder.  Further pressure may help, such as sitting on a small rolled up towel. 2. LIGHT "KEGELS" using elevator imagery.  Breathe in, feel pelvic floor lower to "basement" level by the end of the inhalation. Exhale, feel pelvic floor gradually move up to "1st floor" which is the neutral position of pelvic floor. At the end of exhalation, feel pelvic floor lift higher at which you will perform a quick light squeeze (the muscles that hold back gas and urine).  Do not do hard, maximal contractions, as this will quickly fatigue the muscles and cause leakage. Perform this 5 repetitions in this pattern.  3. BREATHE & STAY CALM.  Breathing slowly and remaining calm will inhibit your sympathetic nervous system, which will in turn calm the bladder.   4. DISTRACTION.  Sit with a project that will engage your mind.  Anything that works for you - reading, word puzzles, crochet, knitting, checking email, balancing the checkbook, and so on. 5. VISUALIZATION.  Imagine that you are in a place/situation  in which either you cannot or do not want to leave.  Examples: in a car and cannot stop; lying on a beach with a far walk to a restroom; at dinner with someone special.  If the urge persists after practicing these steps and feel you must go to the bathroom, then it is imperative that you . . . . ? Walk slowly and calmly to the bathroom ? Maintain calm breathing ? Refrain from undressing until you are standing over the toilet Rushing to the restroom will only encourage the strong bladder urges and leaking.  Again, the more you practice, the easier these steps will become.  ______________   Stretches after biking :  Figure-4 ( as strethces perineal scars) / happy baby pose  5 breaths ______________

## 2017-07-28 ENCOUNTER — Ambulatory Visit: Payer: BC Managed Care – PPO | Attending: Urology | Admitting: Physical Therapy

## 2017-07-28 ENCOUNTER — Encounter: Payer: BC Managed Care – PPO | Admitting: Physical Therapy

## 2017-07-28 DIAGNOSIS — M791 Myalgia, unspecified site: Secondary | ICD-10-CM | POA: Insufficient documentation

## 2017-07-28 DIAGNOSIS — R29898 Other symptoms and signs involving the musculoskeletal system: Secondary | ICD-10-CM | POA: Insufficient documentation

## 2017-07-28 DIAGNOSIS — M2170 Unequal limb length (acquired), unspecified site: Secondary | ICD-10-CM | POA: Insufficient documentation

## 2017-07-28 DIAGNOSIS — R278 Other lack of coordination: Secondary | ICD-10-CM | POA: Diagnosis present

## 2017-07-28 NOTE — Patient Instructions (Addendum)
Strengthening gluts:  mini lunges ( cues record on phone)  10 x 2   X 2 day   Complimentary stretch:  figure-4  ____ Discontinue Deep core level 1 ( quick holds)   Deep core level 2  ( )   _____  PELVIC FLOOR / KEGEL EXERCISES   Pelvic floor/ Kegel exercises are used to strengthen the muscles in the base of your pelvis that are responsible for supporting your pelvic organs and preventing urine/feces leakage. Based on your therapist's recommendations, they can be performed while standing, sitting, or lying down. Imagine pelvic floor area as a diamond with pelvic landmarks: top =pubic bone, bottom tip=tailbone, sides=sitting bones (ischial tuberosities).    Make yourself aware of this muscle group by using these cues while coordinating your breath:  Inhale, feel pelvic floor diamond area lower like hammock towards your feet and ribcage/belly expanding. Pause. Let the exhale naturally and feel your belly sink, abdominal muscles hugging in around you and you may notice the pelvic diamond draws upward towards your head forming a umbrella shape. Give a squeeze during the exhalation like you are stopping the flow of urine. If you are squeezing the buttock muscles, try to give 50% less effort.   Common Errors:  Breath holding: If you are holding your breath, you may be bearing down against your bladder instead of pulling it up. If you belly bulges up while you are squeezing, you are holding your breath. Be sure to breathe gently in and out while exercising. Counting out loud may help you avoid holding your breath.  Accessory muscle use: You should not see or feel other muscle movement when performing pelvic floor exercises. When done properly, no one can tell that you are performing the exercises. Keep the buttocks, belly and inner thighs relaxed.  Overdoing it: Your muscles can fatigue and stop working for you if you over-exercise. You may actually leak more or feel soreness at the lower  abdomen or rectum.  YOUR HOME EXERCISE PROGRAM  LONG HOLDS:   Position: on back or reclined in car seat ( do not lift head to sit up, instead make sure to use the handle to raise the car seat up and keep spine/head relaxed to not place load on pelvic floor/ abdominal muscle)     Inhale and then exhale. Then squeeze the muscle and count aloud for 5 seconds. Rest with three long breaths. (Be sure to let belly sink in with exhales and not push outward)  Perform 4 repetitions, 3  Times/day  ( morning, midafternoon, night)     SHORT HOLDS: Position: on  sitting   Inhale and then exhale. Then squeeze the muscle.  (Be sure to let belly sink in with exhales and not push outward)  Perform 5 repetitions, 5  Times/day ( after breakfast, midmorning, lunch, midafternoon, dinner)                      DECREASE DOWNWARD PRESSURE ON  YOUR PELVIC FLOOR, ABDOMINAL, LOW BACK MUSCLES       PRESERVE YOUR PELVIC HEALTH LONG-TERM   ** SQUEEZE pelvic floor BEFORE YOUR SNEEZE, COUGH, LAUGH   ** EXHALE BEFORE YOU RISE AGAINST GRAVITY (lifting, sit to stand, from squat to stand)   ** LOG ROLL OUT OF BED INSTEAD OF CRUNCH/SIT-UP

## 2017-07-28 NOTE — Therapy (Signed)
[facility] MAIN [facility] SERVICES [address] [place], [place], [address] Phone: [phone]   Fax:  [phone]  Physical Therapy Treatment  Patient Details  Name: Lori Kidd MRN: [id number] Date of Birth: 12-17-1950 Referring Provider: Michiel Cowboy   Encounter Date: 07/28/2017  PT End of Session - 07/28/17 1516    Visit Number  4    Number of Visits  12    Date for PT Re-Evaluation  09/29/17    PT Start Time  1513    PT Stop Time  1612    PT Time Calculation (min)  59 min    Activity Tolerance  Patient tolerated treatment well;No increased pain    Behavior During Therapy  WFL for tasks assessed/performed       Past Medical History:  Diagnosis Date  . Arthritis   . Asthma   . High blood pressure   . High cholesterol   . Hypothyroidism     Past Surgical History:  Procedure Laterality Date  . APPENDECTOMY  1982  . SIGMOID RESECTION / RECTOPEXY  1998  . TONSILLECTOMY  1962  . TUBAL LIGATION  1983  . VAGINAL HYSTERECTOMY  2000    There were no vitals filed for this visit.  Subjective Assessment - 07/28/17 1515    Subjective  Pt reports being able to sleep through the night for 6-7 hours. Pt has used the urge protocol which has helped. Pt has had leakage with leaning forward.     Pertinent History   Pt had a hysterectomy 15 years ago to remove benign tumors on her B ovaries but the left ovary was removed because there was increased scar adhesions.  Pt had sigmoid removed in 1999 ( 20 years ago) and tubal ligation ( 6962).  Pt recently had a US done on the R ovary and findings were negative.  Additional contributing factors: Hx of asthma for 15 years and coughing makes leakage worse.  Hx of HBP.  2 vaginal deliveries, epsiotomies. 10 lb babies. use of forceps in delivery.  Scoliosis             [facility] PT Assessment - 07/28/17 1554      Observation/Other Assessments   Observations  mini lunges with poor knee laignment,  high arch, more anterior COM       Strength   Overall Strength Comments  hip ext B 4-/5, hip abd R 4-/5, L 5/5                 Pelvic Floor Special Questions - 07/28/17 1555    Pelvic Floor Internal Exam  pt consented verbally without contraindications     Exam Type  Vaginal    Palpation  significantly scar restrictions, remaining tightness/ tenderness at 7 o clock/ obt int R     Strength  good squeeze, good lift, able to hold agaisnt strong resistance post Tx: facilitation of vaginal walls all quadrants     Strength # of reps  4    Strength # of seconds  5        [facility] Adult PT Treatment/Exercise - 07/28/17 1558      Neuro Re-ed    Neuro Re-ed Details   see pt instructions       Manual Therapy   Internal Pelvic Floor  fascial release at probelm areas indicated in assessment facilitation of pelvic floor mm all quadrants w/ breathing  PT Education - 07/28/17 1611    Education Details  HEP    Person(s) Educated  Patient    Methods  Explanation;Demonstration;Tactile cues;Verbal cues;Handout    Comprehension  Returned demonstration;Verbalized understanding          PT Long Term Goals - 07/07/17 1315      PT LONG TERM GOAL #1   Title  Pt will be able to shop in store for 45min to 1 hours without needing to get to the toilet     Time  12    Period  Weeks    Status  New    [employer] Date  09/29/17      PT LONG TERM GOAL #2   Title  Pt will decrease her trips to the toilet from 1x/ hr to 1x/ every 2 hours in order to work     Time  8    Period  Weeks    Status  New    Target Date  09/01/17      PT LONG TERM GOAL #3   Title  Pt will decrease her PFDI score from 68% to <50 % in order to restore pelvic function    Time  12    Period  Weeks    Status  New    Target Date  09/29/17      PT LONG TERM GOAL #4   Title  Pt will decrease pdas from 2x day to < 1 x day in order to QOL     Time  6    Period  Weeks    Status  New    Target Date   08/18/17      PT LONG TERM GOAL #5   Title  Pt will demo proper body mechanics to minimzie strain on her pelvic floor and optimize her intraabdominal pressure system in order to perform ADLs and lift her granddtr    Time  4    Period  Weeks    Status  New    Target Date  08/04/17      Additional Long Term Goals   Additional Long Term Goals  Yes      PT LONG TERM GOAL #6   Title  Pt will report no recurrent UTI across 3 months in order to improve QOL    Time  12    Period  Weeks    Status  New    Target Date  09/30/17            Plan - 07/28/17 1618    Clinical Impression Statement Today, pt progressed to endurance pelvic floor training as she showed increased grade strength today with less perineal scar restrictions post Tx. Also progressed pt to upright quick contraction in seated positions. Initiated Teaching laboratory technicianglut strengthening whjch pt demo'd correct alignment and form to minimize knee pain. Pt states urgency is improving. She has been able to sleep through the night without nocturia episodes but emphasized to pt the importance  to screen for OSA given her risk factors with snoring and breathing disorders. Pt voiced understanding.  Pt continues to benefit from skilled PT.    Rehab Potential  Good    PT Frequency  1x / week    PT Duration  12 weeks    PT Treatment/Interventions  Therapeutic exercise;Electrical Stimulation;Neuromuscular re-education;Moist Heat;Manual techniques;Therapeutic activities;Patient/family education;Aquatic Therapy;Stair training;Gait training;Scar mobilization    Consulted and Agree with Plan of Care  Patient       Patient will benefit from  skilled therapeutic intervention in order to improve the following deficits and impairments:  Improper body mechanics, Pain, Abnormal gait, Decreased coordination, Decreased mobility, Decreased scar mobility, Postural dysfunction, Increased muscle spasms, Decreased endurance, Decreased strength, Decreased balance, Impaired  flexibility, Decreased range of motion, Increased fascial restricitons  Visit Diagnosis: Myalgia  Other lack of coordination  Unequal leg length  Other symptoms and signs involving the musculoskeletal system     Problem List Patient Active Problem List   Diagnosis Date Noted  . Acquired hypothyroidism 06/22/2017  . Allergic rhinitis 06/22/2017  . Essential hypertension 06/22/2017  . Mixed hyperlipidemia 06/22/2017  . Primary osteoarthritis of right knee 08/30/2016  . Mild persistent asthma with acute exacerbation 11/15/2013    Mariane Masters ,PT, DPT, E-RYT  07/28/2017, 5:16 PM  [facility] MAIN [facility] SERVICES [address] [place], [place], [address] Phone: [phone]   Fax:  [phone]  Name: Lori Kidd MRN: [id number] Date of Birth: 02/01/1950

## 2017-08-04 ENCOUNTER — Ambulatory Visit: Payer: BC Managed Care – PPO | Admitting: Physical Therapy

## 2017-08-04 ENCOUNTER — Encounter: Payer: BC Managed Care – PPO | Admitting: Physical Therapy

## 2017-08-04 DIAGNOSIS — M791 Myalgia, unspecified site: Secondary | ICD-10-CM

## 2017-08-04 DIAGNOSIS — R29898 Other symptoms and signs involving the musculoskeletal system: Secondary | ICD-10-CM

## 2017-08-04 DIAGNOSIS — R278 Other lack of coordination: Secondary | ICD-10-CM

## 2017-08-04 DIAGNOSIS — M2170 Unequal limb length (acquired), unspecified site: Secondary | ICD-10-CM

## 2017-08-04 NOTE — Therapy (Signed)
[facility] MAIN [facility] SERVICES [address] [place], [place], [address] Phone: [phone]   Fax:  [phone]  Physical Therapy Treatment  Patient Details  Name: Lori Kidd MRN: [id number] Date of Birth: 11/03/1950 Referring Provider: Michiel Cowboy   Encounter Date: 08/04/2017  PT End of Session - 08/04/17 1215    Visit Number  5    Number of Visits  12    Date for PT Re-Evaluation  09/29/17    PT Start Time  1050    PT Stop Time  1205    PT Time Calculation (min)  75 min    Activity Tolerance  Patient tolerated treatment well;No increased pain    Behavior During Therapy  WFL for tasks assessed/performed       Past Medical History:  Diagnosis Date  . Arthritis   . Asthma   . High blood pressure   . High cholesterol   . Hypothyroidism     Past Surgical History:  Procedure Laterality Date  . APPENDECTOMY  1982  . SIGMOID RESECTION / RECTOPEXY  1998  . TONSILLECTOMY  1962  . TUBAL LIGATION  1983  . VAGINAL HYSTERECTOMY  2000    There were no vitals filed for this visit.  Subjective Assessment - 08/04/17 1058    Subjective  Pt reported she is no longer feelign the urge to urinate a second time shortly after emptying. Pt is taking antibiotics for UTI     Pertinent History   Pt had a hysterectomy 15 years ago to remove benign tumors on her B ovaries but the left ovary was removed because there was increased scar adhesions.  Pt had sigmoid removed in 1999 ( 20 years ago) and tubal ligation ( 6962).  Pt recently had a US done on the R ovary and findings were negative.  Additional contributing factors: Hx of asthma for 15 years and coughing makes leakage worse.  Hx of HBP.  2 vaginal deliveries, epsiotomies. 10 lb babies. use of forceps in delivery.  Scoliosis             [facility] PT Assessment - 08/04/17 1220      Palpation   Palpation comment  severely restrictions longitudinal scar over abdomen, minor restrictions   medial to iliac crest and suprapubic areas                    [facility] Adult PT Treatment/Exercise - 08/04/17 1221      Therapeutic Activites    Therapeutic Activities  -- see pt instructions      Manual Therapy   Manual therapy comments  fascial releases with MWM over problem areas              PT Education - 08/04/17 1215    Education Details  HEP    Person(s) Educated  Patient    Methods  Explanation;Demonstration;Tactile cues;Verbal cues;Handout    Comprehension  Returned demonstration;Verbalized understanding          PT Long Term Goals - 07/07/17 1315      PT LONG TERM GOAL #1   Title  Pt will be able to shop in store for to 1 hours without needing to get to the toilet     Time  12    Period  Weeks    Status  New    [employer] Date  09/29/17      PT LONG TERM GOAL #2   Title  Pt will  decrease her trips to the toilet from 1x/ hr to 1x/ every 2 hours in order to work     Time  8    Period  Weeks    Status  New    Target Date  09/01/17      PT LONG TERM GOAL #3   Title  Pt will decrease her PFDI score from 68% to <50 % in order to restore pelvic function    Time  12    Period  Weeks    Status  New    Target Date  09/29/17      PT LONG TERM GOAL #4   Title  Pt will decrease pdas from 2x day to < 1 x day in order to QOL     Time  6    Period  Weeks    Status  New    Target Date  08/18/17      PT LONG TERM GOAL #5   Title  Pt will demo proper body mechanics to minimzie strain on her pelvic floor and optimize her intraabdominal pressure system in order to perform ADLs and lift her granddtr    Time  4    Period  Weeks    Status  New    Target Date  08/04/17      Additional Long Term Goals   Additional Long Term Goals  Yes      PT LONG TERM GOAL #6   Title  Pt will report no recurrent UTI across 3 months in order to improve QOL    Time  12    Period  Weeks    Status  New    Target Date  09/30/17            Plan - 08/04/17  1215    Clinical Impression Statement  Pt is progessing well with report of less post-urination dribble and more complete urination.  Today, addressed pt 's abdominal scar restrictions which decreased post Tx.  Pt reported no pain with Tx. Pt reported feeling her abdominal area feeling looser post Tx. Plan to continue with gentle manual Tx on scars to facilitate more fascial mobility over abdominopelvic area which in turn will help with complete urination and less risk for UTI. Plan to incorporate gym exercises for overall hip and BLE strengthening with co-activation of deep core mm as pt plans to return to the gym in the upcoming weeks. Pt continues to benefit from skilled PT.      Rehab Potential  Good    PT Frequency  1x / week    PT Duration  12 weeks    PT Treatment/Interventions  Therapeutic exercise;Electrical Stimulation;Neuromuscular re-education;Moist Heat;Manual techniques;Therapeutic activities;Patient/family education;Aquatic Therapy;Stair training;Gait training;Scar mobilization    Consulted and Agree with Plan of Care  Patient       Patient will benefit from skilled therapeutic intervention in order to improve the following deficits and impairments:  Improper body mechanics, Pain, Abnormal gait, Decreased coordination, Decreased mobility, Decreased scar mobility, Postural dysfunction, Increased muscle spasms, Decreased endurance, Decreased strength, Decreased balance, Impaired flexibility, Decreased range of motion, Increased fascial restricitons  Visit Diagnosis: Myalgia  Other lack of coordination  Unequal leg length  Other symptoms and signs involving the musculoskeletal system     Problem List Patient Active Problem List   Diagnosis Date Noted  . Acquired hypothyroidism 06/22/2017  . Allergic rhinitis 06/22/2017  . Essential hypertension 06/22/2017  . Mixed hyperlipidemia 06/22/2017  . Primary osteoarthritis of right  knee 08/30/2016  . Mild persistent asthma with  acute exacerbation 11/15/2013    Mariane MastersYeung,Shin Yiing ,PT, DPT, E-RYT  08/04/2017, 12:22 PM  [facility] [address] [place], [place], [address] Phone: [phone]   Fax:  [phone]  Name: Dierdre HighmanJennie Jenkins Rase MRN: [id number] Date of Birth: 01/16/1951

## 2017-08-04 NOTE — Patient Instructions (Signed)
Scar releases Abdominal massage upward from L,  R , center to belly button ( zig zag)  3 stroke x 3 , pressure is gentle and light with all fingers flat, not using finger tips   Pulling gentle at the cross of the scar and then windshield wipers ( knee side to side 30 deg ) 20-30  reps

## 2017-08-11 ENCOUNTER — Ambulatory Visit: Payer: BC Managed Care – PPO | Admitting: Urology

## 2017-08-11 ENCOUNTER — Encounter: Payer: BC Managed Care – PPO | Admitting: Physical Therapy

## 2017-08-11 ENCOUNTER — Ambulatory Visit: Payer: BC Managed Care – PPO | Admitting: Physical Therapy

## 2017-08-11 DIAGNOSIS — R278 Other lack of coordination: Secondary | ICD-10-CM

## 2017-08-11 DIAGNOSIS — M791 Myalgia, unspecified site: Secondary | ICD-10-CM

## 2017-08-11 DIAGNOSIS — R29898 Other symptoms and signs involving the musculoskeletal system: Secondary | ICD-10-CM

## 2017-08-11 DIAGNOSIS — M2170 Unequal limb length (acquired), unspecified site: Secondary | ICD-10-CM

## 2017-08-11 NOTE — Therapy (Signed)
[facility] [address] , [place], [address] Phone: [phone]   Fax:  [phone]  Physical Therapy Treatment  Patient Details  Name: Lori HighmanJennie Jenkins Kidd MRN: [id number] Date of Birth: 09/28/1950 Referring Provider: Michiel CowboyShannon McGowan   Encounter Date: 08/11/2017  PT End of Session - 08/11/17 1138    Visit Number  6    Number of Visits  12    Date for PT Re-Evaluation  09/29/17    PT Start Time  1100    PT Stop Time  1202    PT Time Calculation (min)  62 min    Activity Tolerance  Patient tolerated treatment well;No increased pain    Behavior During Therapy  WFL for tasks assessed/performed       Past Medical History:  Diagnosis Date  . Arthritis   . Asthma   . High blood pressure   . High cholesterol   . Hypothyroidism     Past Surgical History:  Procedure Laterality Date  . APPENDECTOMY  1982  . SIGMOID RESECTION / RECTOPEXY  1998  . TONSILLECTOMY  1962  . TUBAL LIGATION  1983  . VAGINAL HYSTERECTOMY  2000    There were no vitals filed for this visit.  Subjective Assessment - 08/11/17 1103    Subjective  Pt is not going to the bathroom nearly as much. Leaking is only leaning over.  Pt feels she completes urinating and no longer has the urge anymore.  Pt is able to control her u rge now when she hears water running.     Pertinent History   Pt had a hysterectomy 15 years ago to remove benign tumors on her B ovaries but the left ovary was removed because there was increased scar adhesions.  Pt had sigmoid removed in 1999 ( 20 years ago) and tubal ligation ( 0272( 1983).  Pt recently had a US done on the R ovary and findings were negative.  Additional contributing factors: Hx of asthma for 15 years and coughing makes leakage worse.  Hx of HBP.  2 vaginal deliveries, epsiotomies. 10 lb babies. use of forceps in delivery.  Scoliosis             [facility] PT Assessment - 08/11/17 1253      Floor to Stand   Comments  poor carry over, narrow BOS, anterior weight over knees      Strength   Overall Strength Comments  L hip flexiox, knee flex/ext 3+/5,  L 4+/5                 Pelvic Floor Special Questions - 08/11/17 1253    External Perineal Exam  increased tensions at obt int R         [facility] - 08/11/17 1251      Neuro Re-ed    Neuro Re-ed Details   see pt instructions       Exercises   Exercises  -- 4315' Nu Step Level 0-2. no pain.       Manual Therapy   Manual therapy comments  external releases at L Obt int               PT Education - 08/11/17 1138    Education Details  HEP    Person(s) Educated  Patient    Methods  Explanation;Demonstration;Tactile cues;Verbal cues;Handout    Comprehension  Verbalized understanding;Returned demonstration          PT Long Term  Goals - 07/07/17 1315      PT LONG TERM GOAL #1   Title  Pt will be able to shop in store for to 1 hours without needing to get to the toilet     Time  12    Period  Weeks    Status  New    Target Date  09/29/17      PT LONG TERM GOAL #2   Title  Pt will decrease her trips to the toilet from 1x/ hr to 1x/ every 2 hours in order to work     Time  8    Period  Weeks    Status  New    Target Date  09/01/17      PT LONG TERM GOAL #3   Title  Pt will decrease her PFDI score from 68% to <50 % in order to restore pelvic function    Time  12    Period  Weeks    Status  New    Target Date  09/29/17      PT LONG TERM GOAL #4   Title  Pt will decrease pdas from 2x day to < 1 x day in order to QOL     Time  6    Period  Weeks    Status  New    Target Date  08/18/17      PT LONG TERM GOAL #5   Title  Pt will demo proper body mechanics to minimzie strain on her pelvic floor and optimize her intraabdominal pressure system in order to perform ADLs and lift her granddtr    Time  4    Period  Weeks    Status  New    Target Date  08/04/17      Additional Long  Term Goals   Additional Long Term Goals  Yes      PT LONG TERM GOAL #6   Title  Pt will report no recurrent UTI across 3 months in order to improve QOL    Time  12    Period  Weeks    Status  New    Target Date  09/30/17            Plan - 08/11/17 1253    Clinical Impression Statement  Pt is making great progress with less leakage and urgency. Pt has achieved the ability to complete empty her bladder.  Progressed pt to increased reps and duration with endurance strengthening of pelvic floor, thoracolumbar / lower kinetic chain strengthening in anti-gravity position with co-activation of deep core, Nu-Step machine 15' without knee pain. Pt was educated the importance of eperforming stretches to minimize relapse of urinary Sx. Pt tolerated external  Tx at R obturator internus without complaints. Withheld internal Tx as pt is getting over UTI last week. Plan to progress to weight machines education with deep core co-activation as pt would like to get back to her gym routine. Educated using Nu-Step as a safe way to achieve cardio exercise as it will strengthen L LE in preparation for her L knee surgery scheduled at the end of the year while at the same time, Nu-Step will have least amount of strain/ load on her L knee. Pt continues to benefit from skilled PT.      Rehab Potential  Good    PT Frequency  1x / week    PT Duration  12 weeks    PT Treatment/Interventions  Therapeutic exercise;Electrical Stimulation;Neuromuscular re-education;Moist Heat;Manual  techniques;Therapeutic activities;Patient/family education;Aquatic Therapy;Stair training;Gait training;Scar mobilization    Consulted and Agree with Plan of Care  Patient       Patient will benefit from skilled therapeutic intervention in order to improve the following deficits and impairments:  Improper body mechanics, Pain, Abnormal gait, Decreased coordination, Decreased mobility, Decreased scar mobility, Postural dysfunction, Increased  muscle spasms, Decreased endurance, Decreased strength, Decreased balance, Impaired flexibility, Decreased range of motion, Increased fascial restricitons  Visit Diagnosis: Myalgia  Unequal leg length  Other symptoms and signs involving the musculoskeletal system  Other lack of coordination     Problem List Patient Active Problem List   Diagnosis Date Noted  . Acquired hypothyroidism 06/22/2017  . Allergic rhinitis 06/22/2017  . Essential hypertension 06/22/2017  . Mixed hyperlipidemia 06/22/2017  . Primary osteoarthritis of right knee 08/30/2016  . Mild persistent asthma with acute exacerbation 11/15/2013    Mariane Masters ,PT, DPT, E-RYT  08/11/2017, 12:58 PM  [place] [facility] MAIN [facility] SERVICES [address] [place], [place], [address] Phone: [phone]   Fax:  [phone]  Name: Lori Kidd MRN: [id number] Date of Birth: [birth year]

## 2017-08-11 NOTE — Patient Instructions (Addendum)
Pelvic floor progress to 5 sec, 10 reps x 3 x day    Transition from stand to floor  "think football stance first" not diving  Wider stance Mini squat butt back, knees behind toes)    ______  Bridging series w/ resistive band other side of doorknob:  Level 1:  Position:  Elbows bent, knees hip width apart, heels under knees on top of stable  foot stool   Stabilization points: shoulders, upper arms, back of head pressed into floor. Heel press downward.   Movement: inhale do nothing, exhale pull band by side, lower fists to floor completely while lifting hips.Keep stabilization points engaged when you allow the band to go back to starting position  10 x 2 reps       Level 2:  Position:  Elbows straight, arms raised to ceiling at shoulder height, knees apart like a ballerina,heels together, heels under knees, on top of stable  foot stool   Stabilization points: shoulders, upper arms, back of head pressed into floor. Heel press downward.   Movement: inhale do nothing, exhale pull band by side, lower fists to floor completely while lifting hips. Keep stabilization points engaged when you allow the band to go back to starting position   10 x 2 reps  Shoulder training: Try to imagine you are squeezing a pencil under your armpit and your shoulder blades are down away from your ears and towards each other     ______  Complimentary stretches:   Hamstring stretch on the chair  one knee straight but not locked, toes up  lean to thigh, opposite arm up and over and turn to sky, high five

## 2017-08-18 ENCOUNTER — Ambulatory Visit: Payer: BC Managed Care – PPO | Admitting: Physical Therapy

## 2017-08-18 ENCOUNTER — Encounter: Payer: BC Managed Care – PPO | Admitting: Physical Therapy

## 2017-08-18 DIAGNOSIS — M791 Myalgia, unspecified site: Secondary | ICD-10-CM

## 2017-08-18 DIAGNOSIS — R29898 Other symptoms and signs involving the musculoskeletal system: Secondary | ICD-10-CM

## 2017-08-18 DIAGNOSIS — R278 Other lack of coordination: Secondary | ICD-10-CM

## 2017-08-18 DIAGNOSIS — M2170 Unequal limb length (acquired), unspecified site: Secondary | ICD-10-CM

## 2017-08-19 NOTE — Therapy (Addendum)
[facility] MAIN [facility] SERVICES [address] [place], [place], [address] Phone: [phone]   Fax:  [phone]  Physical Therapy Treatment  Patient Details  Name: Lori Kidd MRN: [id number] Date of Birth: [birth year] Referring Provider: Michiel Cowboy   Encounter Date: 08/18/2017    Past Medical History:  Diagnosis Date  . Arthritis   . Asthma   . High blood pressure   . High cholesterol   . Hypothyroidism     Past Surgical History:  Procedure Laterality Date  . APPENDECTOMY  1982  . SIGMOID RESECTION / RECTOPEXY  1998  . TONSILLECTOMY  1962  . TUBAL LIGATION  1983  . VAGINAL HYSTERECTOMY  2000    There were no vitals filed for this visit.  Subjective Assessment - 08/19/17 2205    Subjective  Pt is not going to the bathroom nearly as much. Leaking is only leaning over.  Pt feels she completes urinating and no longer has the urge anymore.  Pt is able to control her u rge now when she hears water running.     Pertinent History   Pt had a hysterectomy 15 years ago to remove benign tumors on her B ovaries but the left ovary was removed because there was increased scar adhesions.  Pt had sigmoid removed in 1999 ( 20 years ago) and tubal ligation ( 2951).  Pt recently had a US done on the R ovary and findings were negative.  Additional contributing factors: Hx of asthma for 15 years and coughing makes leakage worse.  Hx of HBP.  2 vaginal deliveries, epsiotomies. 10 lb babies. use of forceps in delivery.  Scoliosis                        Pelvic Floor Special Questions - 08/19/17 2212    External Perineal Exam  increased tightness at ischiocavernosis, mons pubis, bulbospongiosus B ( decreased post Tx)          [facility] Adult PT Treatment/Exercise - 08/19/17 2205      Neuro Re-ed    Neuro Re-ed Details   proper co-activation of deep core with leg machines : leg press, hamstring curl, leg extension      Manual  Therapy   Manual therapy comments  STM. MWM at problem areas noted in assessment : external application              PT Education - 08/19/17 2214    Education Details  HEP    Person(s) Educated  Patient    Methods  Explanation;Demonstration;Tactile cues;Verbal cues;Handout    Comprehension  Returned demonstration;Verbalized understanding          PT Long Term Goals - 08/19/17 2215      PT LONG TERM GOAL #1   Title  Pt will be able to shop in store for to 1 hours without needing to get to the toilet     Time  12    Period  Weeks    Status  Achieved      PT LONG TERM GOAL #2   Title  Pt will decrease her trips to the toilet from 1x/ hr to 1x/ every 2 hours in order to work     Time  8    Period  Weeks    Status  Achieved      PT LONG TERM GOAL #3   Title  Pt will decrease her PFDI score from 68% to <  50 % in order to restore pelvic function    Time  12    Period  Weeks    Status  On-going      PT LONG TERM GOAL #4   Title  Pt will decrease pads from 2x day to < 1 x day in order to QOL     Time  6    Period  Weeks    Status  Achieved      PT LONG TERM GOAL #5   Title  Pt will demo proper body mechanics to minimzie strain on her pelvic floor and optimize her intraabdominal pressure system in order to perform ADLs and lift her granddtr    Time  4    Period  Weeks    Status  Achieved      PT LONG TERM GOAL #6   Title  Pt will report no recurrent UTI across 3 months in order to improve QOL    Time  12    Period  Weeks    Status  On-going            Plan - 08/19/17 2216    Clinical Impression Statement  Pt's improvements include sleeping through the night, urge control, and less leakage while shopping and lifting grandbaby.  Pt has returned to weight machines at the gym but pt was advised to avoid the hip ab/add machine due to the increased tightness at the anterior mm which can limit proper lengthening of urethra sphincter and in turn, may be  associated with increased risk for UTI. Applied manual Tx to decrease mm tensions which she tolerated with signs of decreased mm tightness and proper lengthening of muscles. Pt has been guided to gradually increase her pelvic floor strengthening and BLE strengthening. Pt will return in one month for skilled PT after practicing HEP on her own.  Antcipate will achieve her remaining goals.    Rehab Potential  Good    PT Frequency  1x / week    PT Duration  12 weeks    PT Treatment/Interventions  Therapeutic exercise;Electrical Stimulation;Neuromuscular re-education;Moist Heat;Manual techniques;Therapeutic activities;Patient/family education;Aquatic Therapy;Stair training;Gait training;Scar mobilization    Consulted and Agree with Plan of Care  Patient       Patient will benefit from skilled therapeutic intervention in order to improve the following deficits and impairments:  Improper body mechanics, Pain, Abnormal gait, Decreased coordination, Decreased mobility, Decreased scar mobility, Postural dysfunction, Increased muscle spasms, Decreased endurance, Decreased strength, Decreased balance, Impaired flexibility, Decreased range of motion, Increased fascial restricitons  Visit Diagnosis: Myalgia  Unequal leg length  Other symptoms and signs involving the musculoskeletal system  Other lack of coordination     Problem List Patient Active Problem List   Diagnosis Date Noted  . Acquired hypothyroidism 06/22/2017  . Allergic rhinitis 06/22/2017  . Essential hypertension 06/22/2017  . Mixed hyperlipidemia 06/22/2017  . Primary osteoarthritis of right knee 08/30/2016  . Mild persistent asthma with acute exacerbation 11/15/2013    Mariane MastersYeung,Shin Yiing ,PT, DPT, E-RYT  08/19/2017, 10:19 PM  [facility] [address] [place], [place], [address] Phone: [phone]   Fax:  [phone]  Name: Lori Kidd [place] MRN: [id number] Date  of Birth: 12/03/1950

## 2017-08-25 ENCOUNTER — Encounter: Payer: BC Managed Care – PPO | Admitting: Physical Therapy

## 2017-09-06 ENCOUNTER — Encounter: Payer: Self-pay | Admitting: Physical Therapy

## 2017-09-06 DIAGNOSIS — R29898 Other symptoms and signs involving the musculoskeletal system: Secondary | ICD-10-CM

## 2017-09-06 DIAGNOSIS — R278 Other lack of coordination: Secondary | ICD-10-CM

## 2017-09-06 DIAGNOSIS — M2170 Unequal limb length (acquired), unspecified site: Secondary | ICD-10-CM

## 2017-09-06 DIAGNOSIS — M791 Myalgia, unspecified site: Secondary | ICD-10-CM

## 2017-09-06 NOTE — Therapy (Signed)
[facility] [address] [place], [place], [address] Phone: [phone]   Fax:  [phone]  Patient Details  Name: Lori Kidd MRN: [id number] Date of Birth: 04/09/1950 Referring Provider:  No ref. provider found  Encounter Date: 09/06/2017  Discharge Summary   Pt has achieved 4/6 goals across the past 7 visits. Pt was progressing well towards remaining goals but DPT was not able to assess these goals over the phone. Pt's improvements include sleeping through the night, urge control, and less leakage while shopping and lifting grandbaby. Pt has shown significantly improved pelvic alignment, scar restrictions over abdomen and pelvic floor, and less spinal /hip/ pelvic floor tightness . Pt has also demo'd proper ways to trasfer from floor <>stand  and carry/lifting granddaughter with less strain onto her pelvic floor.    Pt has returned to weight machines at the gym but pt was advised to avoid the hip ab/add machine due to the increased tightness at the anterior mm which can limit proper lengthening of urethra sphincter and in turn, may be associated with increased risk for UTI. Applied manual Tx to decrease mm tensions which she tolerated with signs of decreased mm tightness and proper lengthening of muscles. Pt has been guided to gradually increase her pelvic floor strengthening and BLE strengthening.  Anticipate this will help pt minimize recurrent UTIs in the future.  Pt was educated about strengthening prior to her her knee surgery to yield greater outcomes. Pt reported she has returned to working out at the gym 3 x a week without issues. Pt had some set back with leakage when she got a cold with coughing but voiced understanding the importance of continuing of her pelvic floor exercises. Pt states she has improved by 80% on her Sx. Pt voiced she will be talking to her PCP about sleep study as recommended to screen for OSA given  her risk factors. Pt is ready for d/c at this time .      PT Long Term Goals - 08/19/17 2215      PT LONG TERM GOAL #1   Title  Pt will be able to shop in store for 45min to 1 hours without needing to get to the toilet     Time  12    Period  Weeks    Status  Achieved      PT LONG TERM GOAL #2   Title  Pt will decrease her trips to the toilet from 1x/ hr to 1x/ every 2 hours in order to work     Time  8    Period  Weeks    Status  Achieved      PT LONG TERM GOAL #3   Title  Pt will decrease her PFDI score from 68% to <50 % in order to restore pelvic function    Time  12    Period  Weeks    Status  On-going      PT LONG TERM GOAL #4   Title  Pt will decrease pads from 2x day to < 1 x day in order to QOL     Time  6    Period  Weeks    Status  Achieved      PT LONG TERM GOAL #5   Title  Pt will demo proper body mechanics to minimzie strain on her pelvic floor and optimize her intraabdominal pressure system in order to perform ADLs and lift her granddtr  Time  4    Period  Weeks    Status  Achieved      PT LONG TERM GOAL #6   Title  Pt will report no recurrent UTI across 3 months in order to improve QOL    Time  12    Period  Weeks    Status  On-going        Mariane MastersYeung,Shin Yiing ,[place]PT, DPT, E-RYT  09/06/2017, 2:04 PM  [facility] [address] [place], [place], [address] Phone: [phone]   Fax:  [phone]

## 2017-09-15 ENCOUNTER — Encounter: Payer: BC Managed Care – PPO | Admitting: Physical Therapy

## 2017-10-06 DIAGNOSIS — Z6830 Body mass index (BMI) 30.0-30.9, adult: Secondary | ICD-10-CM

## 2017-10-06 DIAGNOSIS — E6609 Other obesity due to excess calories: Secondary | ICD-10-CM | POA: Insufficient documentation

## 2017-12-01 ENCOUNTER — Ambulatory Visit: Payer: Medicare Other | Attending: Internal Medicine

## 2017-12-01 DIAGNOSIS — G4761 Periodic limb movement disorder: Secondary | ICD-10-CM | POA: Diagnosis not present

## 2017-12-01 DIAGNOSIS — G4733 Obstructive sleep apnea (adult) (pediatric): Secondary | ICD-10-CM | POA: Insufficient documentation

## 2017-12-24 NOTE — Discharge Instructions (Signed)
°  Instructions after Total Knee Replacement ° ° Lori Kidd, Jr., M.D.    ° Dept. of Orthopaedics & Sports Medicine ° [facility] ° [address] ° [place], [place]  [address] ° Phone: [phone]   Fax: [phone] ° °  °DIET: °• Drink plenty of non-alcoholic fluids. °• Resume your normal diet. Include foods high in fiber. ° °ACTIVITY:  °• You may use crutches or a walker with weight-bearing as tolerated, unless instructed otherwise. °• You may be weaned off of the walker or crutches by your Physical Therapist.  °• Do NOT place pillows under the knee. Anything placed under the knee could limit your ability to straighten the knee.   °• Continue doing gentle exercises. Exercising will reduce the pain and swelling, increase motion, and prevent muscle weakness.   °• Please continue to use the TED compression stockings for 6 weeks. You may remove the stockings at night, but should reapply them in the morning. °• Do not drive or operate any equipment until instructed. ° °WOUND CARE:  °• Continue to use the PolarCare or ice packs periodically to reduce pain and swelling. °• You may bathe or shower after the staples are removed at the first office visit following surgery. ° °MEDICATIONS: °• You may resume your regular medications. °• Please take the pain medication as prescribed on the medication. °• Do not take pain medication on an empty stomach. °• You have been given a prescription for a blood thinner (Lovenox or Coumadin). Please take the medication as instructed. (NOTE: After completing a 2 week course of Lovenox, take one Enteric-coated aspirin once a day. This along with elevation will help reduce the possibility of phlebitis in your operated leg.) °• Do not drive or drink alcoholic beverages when taking pain medications. ° °CALL THE OFFICE FOR: °• Temperature above 101 degrees °• Excessive bleeding or drainage on the dressing. °• Excessive swelling, coldness, or paleness of the toes. °• Persistent  nausea and vomiting. ° °FOLLOW-UP:  °• You should have an appointment to return to the office in 10-14 days after surgery. °• Arrangements have been made for continuation of Physical Therapy (either home therapy or outpatient therapy). °  °

## 2018-01-12 ENCOUNTER — Other Ambulatory Visit: Payer: Self-pay

## 2018-01-12 ENCOUNTER — Encounter
Admission: RE | Admit: 2018-01-12 | Discharge: 2018-01-12 | Disposition: A | Discharge status: Home / Self Care | Payer: Medicare Other | Source: Ambulatory Visit | Attending: Orthopedic Surgery | Admitting: Orthopedic Surgery

## 2018-01-12 DIAGNOSIS — Z01818 Encounter for other preprocedural examination: Secondary | ICD-10-CM | POA: Diagnosis not present

## 2018-01-12 HISTORY — DX: Pneumonia, unspecified organism: J18.9

## 2018-01-12 LAB — COMPREHENSIVE METABOLIC PANEL
ALT: 19 U/L (ref 0–44)
AST: 21 U/L (ref 15–41)
Albumin: 4.8 g/dL (ref 3.5–5.0)
Alkaline Phosphatase: 72 U/L (ref 38–126)
Anion gap: 7 (ref 5–15)
BUN: 25 mg/dL — ABNORMAL HIGH (ref 8–23)
CO2: 29 mmol/L (ref 22–32)
Calcium: 9.6 mg/dL (ref 8.9–10.3)
Chloride: 103 mmol/L (ref 98–111)
Creatinine, Ser: 0.81 mg/dL (ref 0.44–1.00)
GFR calc Af Amer: >60 mL/min (ref >60)
GFR calc non Af Amer: >60 mL/min (ref >60)
Glucose, Bld: 90 mg/dL (ref 70–99)
POTASSIUM: 3.7 mmol/L (ref 3.5–5.1)
Sodium: 139 mmol/L (ref 135–145)
Total Bilirubin: 1 mg/dL (ref 0.3–1.2)
Total Protein: 8.1 g/dL (ref 6.5–8.1)

## 2018-01-12 LAB — TYPE AND SCREEN
ABO/RH(D): O POS
ANTIBODY SCREEN: NEGATIVE

## 2018-01-12 LAB — SEDIMENTATION RATE: Sed Rate: 11 mm/hr (ref 0–30)

## 2018-01-12 LAB — URINALYSIS, ROUTINE W REFLEX MICROSCOPIC
Bilirubin Urine: NEGATIVE
Glucose, UA: NEGATIVE mg/dL
Hgb urine dipstick: NEGATIVE
Ketones, ur: NEGATIVE mg/dL
Leukocytes, UA: NEGATIVE
Nitrite: NEGATIVE
Protein, ur: NEGATIVE mg/dL
Specific Gravity, Urine: 1.015 (ref 1.005–1.030)
pH: 7 (ref 5.0–8.0)

## 2018-01-12 LAB — SURGICAL PCR SCREEN
MRSA, PCR: NEGATIVE
STAPHYLOCOCCUS AUREUS: POSITIVE — AB

## 2018-01-12 LAB — C-REACTIVE PROTEIN: CRP: <0.8 mg/dL (ref <1.0)

## 2018-01-12 LAB — CBC
HCT: 43.6 % (ref 36.0–46.0)
Hemoglobin: 14.7 g/dL (ref 12.0–15.0)
MCH: 30.7 pg (ref 26.0–34.0)
MCHC: 33.7 g/dL (ref 30.0–36.0)
MCV: 91 fL (ref 80.0–100.0)
Platelets: 302 10*3/uL (ref 150–400)
RBC: 4.79 MIL/uL (ref 3.87–5.11)
RDW: 12.2 % (ref 11.5–15.5)
WBC: 6.9 10*3/uL (ref 4.0–10.5)
nRBC: 0 % (ref 0.0–0.2)

## 2018-01-12 LAB — PROTIME-INR
INR: 0.9
Prothrombin Time: 12.1 s (ref 11.4–15.2)

## 2018-01-12 LAB — APTT: aPTT: 42 s — ABNORMAL HIGH (ref 24–36)

## 2018-01-12 NOTE — Pre-Procedure Instructions (Signed)
Lab results from PAT visit faxed to Dr. Elenor LegatoHooten's office.  Elevated APTT, Staph aureus positive PCR screen.  Confirmation of fax transmission 01-12-2018 @ 1556.

## 2018-01-12 NOTE — Patient Instructions (Signed)
  Your procedure is scheduled on: Monday January 24, 2018 Report to Same Day Surgery 2nd floor [facility](Medical Mall Entrance-take elevator on left to 2nd floor.  Check in with surgery information desk.) To find out your arrival time, call [phone] 1:00-3:00 PM on Friday January 21, 2018  Remember: Instructions that are not followed completely may result in serious medical risk, up to and including death, or upon the discretion of your surgeon and anesthesiologist your surgery may need to be rescheduled.    __x__ 1. Do not eat food (including mints, candies, chewing gum) after midnight the night before your procedure. You may drink clear liquids up to 2 hours before you are scheduled to arrive at the hospital for your procedure.  Do not drink anything within 2 hours of your scheduled arrival to the hospital.  Approved clear liquids:  --Water or Apple juice without pulp  --Clear carbohydrate beverage such as Gatorade or Powerade  --Black Coffee or Clear Tea (No milk, no creamers, do not add anything to the coffee or tea)    __x__ 2. No Alcohol for 24 hours before or after surgery.   __x__ 3. No Smoking or e-cigarettes for 24 hours before surgery.  Do not use any chewable tobacco products for at least 6 hours before surgery.   __x__ 4. Notify your doctor if there is any change in your medical condition (cold, fever, infections).   __x__ 5. On the morning of surgery brush your teeth with toothpaste and water.  You may rinse your mouth with mouthwash if you wish.  Do not swallow any toothpaste or mouthwash.  Please read over the following fact sheets that you were given:   [facility] Preparing for Surgery and/or MRSA Information    __x__ Use CHG Soap or Sage wipes as directed on instruction sheet    Do not wear jewelry, make-up, hairpins, clips or nail polish on the day of surgery.  Do not wear lotions, powders, deodorant, or perfumes.   Do not shave below the face/neck 48  hours prior to surgery.   Do not bring valuables to the hospital.    [facility] is not responsible for any belongings or valuables.               Contacts, dentures or bridgework may not be worn into surgery.  Leave your suitcase in the car. After surgery it may be brought to your room.  For patients admitted to the hospital, discharge time is determined by your treatment team.  __x__ Take these medications with a small sip of water on the morning of surgery:   1. Levothyroxine (Synthroid)  2. Loratadine (Claritin)   *Do not take your Triamterene-Hydrochlorothiazide (Maxzide) on the morning of surgery.    __x__ Use inhalers on the day of surgery and bring them with you to the hospital.  __x__ Follow recommendations from Cardiologist, Pulmonologist or PCP regarding stopping Aspirin, Coumadin, Plavix, Eliquis, Effient, Pradaxa, and Pletal.  __x__ At least 7 days before surgery: Stop Anti-inflammatories such as Advil, Ibuprofen, Motrin, Aleve, Naproxen, Naprosyn, BC/Goodies powders or aspirin products. You may continue to take Tylenol and Celebrex.   __x__ At least 7 days before surgery: Stop supplements (Cranberry, Elderberry, Melatonin, Fish Oil) until after surgery. You may continue to take Vitamin D, Vitamin B, and multivitamin.

## 2018-01-13 LAB — URINE CULTURE
Culture: NO GROWTH
SPECIAL REQUESTS: NORMAL

## 2018-01-23 MED ORDER — TRANEXAMIC ACID-NACL 1000-0.7 MG/100ML-% IV SOLN
1000.0000 mg | INTRAVENOUS | Status: DC
  Filled 2018-01-23: qty 100

## 2018-01-23 MED ORDER — CLINDAMYCIN PHOSPHATE 900 MG/50ML IV SOLN: 900.0000 mg | INTRAVENOUS | Status: DC

## 2018-01-24 ENCOUNTER — Inpatient Hospital Stay: Payer: Medicare Other | Admitting: Anesthesiology

## 2018-01-24 ENCOUNTER — Inpatient Hospital Stay
Admission: RE | Admit: 2018-01-24 | Discharge: 2018-01-26 | DRG: 470 | Disposition: A | Discharge status: Home / Self Care | Payer: Medicare Other | Attending: Orthopedic Surgery | Admitting: Orthopedic Surgery

## 2018-01-24 ENCOUNTER — Encounter
Admission: RE | Disposition: A | Discharge status: Home / Self Care | Payer: Self-pay | Source: Home / Self Care | Attending: Orthopedic Surgery

## 2018-01-24 ENCOUNTER — Other Ambulatory Visit: Payer: Self-pay

## 2018-01-24 ENCOUNTER — Inpatient Hospital Stay: Payer: Medicare Other

## 2018-01-24 ENCOUNTER — Encounter: Payer: Self-pay | Admitting: Orthopedic Surgery

## 2018-01-24 DIAGNOSIS — J45909 Unspecified asthma, uncomplicated: Secondary | ICD-10-CM | POA: Diagnosis present

## 2018-01-24 DIAGNOSIS — M170 Bilateral primary osteoarthritis of knee: Secondary | ICD-10-CM | POA: Diagnosis present

## 2018-01-24 DIAGNOSIS — M1711 Unilateral primary osteoarthritis, right knee: Secondary | ICD-10-CM | POA: Diagnosis present

## 2018-01-24 DIAGNOSIS — Z6830 Body mass index (BMI) 30.0-30.9, adult: Secondary | ICD-10-CM | POA: Diagnosis not present

## 2018-01-24 DIAGNOSIS — E669 Obesity, unspecified: Secondary | ICD-10-CM | POA: Diagnosis present

## 2018-01-24 DIAGNOSIS — Z96651 Presence of right artificial knee joint: Secondary | ICD-10-CM

## 2018-01-24 DIAGNOSIS — E039 Hypothyroidism, unspecified: Secondary | ICD-10-CM | POA: Diagnosis present

## 2018-01-24 DIAGNOSIS — Z96659 Presence of unspecified artificial knee joint: Secondary | ICD-10-CM

## 2018-01-24 DIAGNOSIS — E7800 Pure hypercholesterolemia, unspecified: Secondary | ICD-10-CM | POA: Diagnosis present

## 2018-01-24 DIAGNOSIS — I10 Essential (primary) hypertension: Secondary | ICD-10-CM | POA: Diagnosis present

## 2018-01-24 HISTORY — PX: KNEE ARTHROPLASTY: SHX992

## 2018-01-24 LAB — ABO/RH: ABO/RH(D): O POS

## 2018-01-24 SURGERY — ARTHROPLASTY, KNEE, TOTAL, USING IMAGELESS COMPUTER-ASSISTED NAVIGATION
Anesthesia: General | Site: Knee | Laterality: Right

## 2018-01-24 MED ORDER — CELECOXIB 200 MG PO CAPS
200.0000 mg | ORAL_CAPSULE | Freq: Two times a day (BID) | ORAL | Status: DC
  Administered 2018-01-24 – 2018-01-26 (×4): 200 mg via ORAL
  Filled 2018-01-24 (×4): qty 1

## 2018-01-24 MED ORDER — METOCLOPRAMIDE HCL 10 MG PO TABS: 5.0000 mg | ORAL_TABLET | Freq: Three times a day (TID) | ORAL | Status: DC | PRN

## 2018-01-24 MED ORDER — GLYCOPYRROLATE 0.2 MG/ML IJ SOLN
INTRAMUSCULAR | Status: DC | PRN
  Administered 2018-01-24: 0.2 mg via INTRAVENOUS

## 2018-01-24 MED ORDER — FENTANYL CITRATE (PF) 100 MCG/2ML IJ SOLN
INTRAMUSCULAR | Status: AC
  Filled 2018-01-24: qty 2

## 2018-01-24 MED ORDER — SODIUM CHLORIDE 0.9 % IV SOLN
INTRAVENOUS | Status: DC | PRN
  Administered 2018-01-24: 12:00:00

## 2018-01-24 MED ORDER — PANTOPRAZOLE SODIUM 40 MG PO TBEC
40.0000 mg | DELAYED_RELEASE_TABLET | Freq: Two times a day (BID) | ORAL | Status: DC
  Administered 2018-01-24 – 2018-01-26 (×4): 40 mg via ORAL
  Filled 2018-01-24 (×4): qty 1

## 2018-01-24 MED ORDER — SODIUM CHLORIDE 0.9 % IV SOLN
INTRAVENOUS | Status: DC
  Administered 2018-01-24: 17:00:00 via INTRAVENOUS

## 2018-01-24 MED ORDER — ACETAMINOPHEN 10 MG/ML IV SOLN
1000.0000 mg | Freq: Four times a day (QID) | INTRAVENOUS | Status: AC
  Administered 2018-01-24 – 2018-01-25 (×3): 1000 mg via INTRAVENOUS
  Filled 2018-01-24 (×4): qty 100

## 2018-01-24 MED ORDER — FENTANYL CITRATE (PF) 100 MCG/2ML IJ SOLN
INTRAMUSCULAR | Status: DC | PRN
  Administered 2018-01-24: 50 ug via INTRAVENOUS
  Administered 2018-01-24 (×2): 25 ug via INTRAVENOUS

## 2018-01-24 MED ORDER — LIDOCAINE HCL (CARDIAC) PF 100 MG/5ML IV SOSY
PREFILLED_SYRINGE | INTRAVENOUS | Status: DC | PRN
  Administered 2018-01-24: 100 mg via INTRAVENOUS

## 2018-01-24 MED ORDER — GENTAMICIN SULFATE 40 MG/ML IJ SOLN
INTRAMUSCULAR | Status: AC
  Filled 2018-01-24: qty 8

## 2018-01-24 MED ORDER — BUPIVACAINE HCL (PF) 0.5 % IJ SOLN
INTRAMUSCULAR | Status: AC
  Filled 2018-01-24: qty 10

## 2018-01-24 MED ORDER — BISACODYL 10 MG RE SUPP: 10.0000 mg | Freq: Every day | RECTAL | Status: DC | PRN

## 2018-01-24 MED ORDER — PROPOFOL 500 MG/50ML IV EMUL
INTRAVENOUS | Status: AC
  Filled 2018-01-24: qty 50

## 2018-01-24 MED ORDER — CELECOXIB 200 MG PO CAPS
400.0000 mg | ORAL_CAPSULE | Freq: Once | ORAL | Status: AC
  Administered 2018-01-24: 400 mg via ORAL

## 2018-01-24 MED ORDER — SODIUM CHLORIDE 0.9 % IV SOLN
INTRAVENOUS | Status: DC | PRN
  Administered 2018-01-24: 60 mL

## 2018-01-24 MED ORDER — PROPOFOL 10 MG/ML IV BOLUS
INTRAVENOUS | Status: DC | PRN
  Administered 2018-01-24 (×3): 16 mg via INTRAVENOUS

## 2018-01-24 MED ORDER — DIPHENHYDRAMINE HCL 12.5 MG/5ML PO ELIX: 12.5000 mg | ORAL_SOLUTION | ORAL | Status: DC | PRN

## 2018-01-24 MED ORDER — GABAPENTIN 300 MG PO CAPS
300.0000 mg | ORAL_CAPSULE | Freq: Every day | ORAL | Status: DC
  Administered 2018-01-24 – 2018-01-25 (×2): 300 mg via ORAL
  Filled 2018-01-24 (×2): qty 1

## 2018-01-24 MED ORDER — HYDROMORPHONE HCL 1 MG/ML IJ SOLN
0.5000 mg | INTRAMUSCULAR | Status: DC | PRN
  Administered 2018-01-24: 0.5 mg via INTRAVENOUS
  Filled 2018-01-24: qty 1

## 2018-01-24 MED ORDER — CEFAZOLIN SODIUM-DEXTROSE 2-4 GM/100ML-% IV SOLN
INTRAVENOUS | Status: AC
  Filled 2018-01-24: qty 100

## 2018-01-24 MED ORDER — ALUM & MAG HYDROXIDE-SIMETH 200-200-20 MG/5ML PO SUSP: 30.0000 mL | ORAL | Status: DC | PRN

## 2018-01-24 MED ORDER — DEXAMETHASONE SODIUM PHOSPHATE 10 MG/ML IJ SOLN
8.0000 mg | Freq: Once | INTRAMUSCULAR | Status: AC
  Administered 2018-01-24: 8 mg via INTRAVENOUS

## 2018-01-24 MED ORDER — TRAMADOL HCL 50 MG PO TABS
50.0000 mg | ORAL_TABLET | ORAL | Status: DC | PRN
  Administered 2018-01-25 – 2018-01-26 (×2): 50 mg via ORAL
  Filled 2018-01-24 (×2): qty 1

## 2018-01-24 MED ORDER — LIDOCAINE HCL (PF) 2 % IJ SOLN
INTRAMUSCULAR | Status: AC
  Filled 2018-01-24: qty 10

## 2018-01-24 MED ORDER — METOCLOPRAMIDE HCL 5 MG/ML IJ SOLN: 5.0000 mg | Freq: Three times a day (TID) | INTRAMUSCULAR | Status: DC | PRN

## 2018-01-24 MED ORDER — LACTATED RINGERS IV SOLN
INTRAVENOUS | Status: DC
  Administered 2018-01-24 (×2): via INTRAVENOUS

## 2018-01-24 MED ORDER — MENTHOL 3 MG MT LOZG
1.0000 | LOZENGE | OROMUCOSAL | Status: DC | PRN
  Filled 2018-01-24: qty 9

## 2018-01-24 MED ORDER — BUPIVACAINE HCL (PF) 0.25 % IJ SOLN
INTRAMUSCULAR | Status: AC
  Filled 2018-01-24: qty 60

## 2018-01-24 MED ORDER — SODIUM CHLORIDE 0.9 % IV SOLN
INTRAVENOUS | Status: DC | PRN
  Administered 2018-01-24: 30 ug/min via INTRAVENOUS

## 2018-01-24 MED ORDER — TETRACAINE HCL 1 % IJ SOLN
INTRAMUSCULAR | Status: DC | PRN
  Administered 2018-01-24: 4 mg via INTRASPINAL

## 2018-01-24 MED ORDER — FLUTICASONE PROPIONATE 50 MCG/ACT NA SUSP
1.0000 | Freq: Every day | NASAL | Status: DC
  Administered 2018-01-24 – 2018-01-26 (×3): 1 via NASAL
  Filled 2018-01-24: qty 16

## 2018-01-24 MED ORDER — ALBUTEROL SULFATE (2.5 MG/3ML) 0.083% IN NEBU: 3.0000 mL | INHALATION_SOLUTION | Freq: Four times a day (QID) | RESPIRATORY_TRACT | Status: DC | PRN

## 2018-01-24 MED ORDER — TRANEXAMIC ACID-NACL 1000-0.7 MG/100ML-% IV SOLN
INTRAVENOUS | Status: DC | PRN
  Administered 2018-01-24: 1000 mg via INTRAVENOUS

## 2018-01-24 MED ORDER — ENOXAPARIN SODIUM 30 MG/0.3ML ~~LOC~~ SOLN
30.0000 mg | Freq: Two times a day (BID) | SUBCUTANEOUS | Status: DC
  Administered 2018-01-25 – 2018-01-26 (×3): 30 mg via SUBCUTANEOUS
  Filled 2018-01-24 (×3): qty 0.3

## 2018-01-24 MED ORDER — FAMOTIDINE 20 MG PO TABS
ORAL_TABLET | ORAL | Status: AC
  Administered 2018-01-24: 20 mg via ORAL
  Filled 2018-01-24: qty 1

## 2018-01-24 MED ORDER — BUPIVACAINE HCL (PF) 0.25 % IJ SOLN
INTRAMUSCULAR | Status: DC | PRN
  Administered 2018-01-24: 60 mL

## 2018-01-24 MED ORDER — METOCLOPRAMIDE HCL 10 MG PO TABS
10.0000 mg | ORAL_TABLET | Freq: Three times a day (TID) | ORAL | Status: DC
  Administered 2018-01-24 – 2018-01-26 (×7): 10 mg via ORAL
  Filled 2018-01-24 (×7): qty 1

## 2018-01-24 MED ORDER — SODIUM CHLORIDE 0.9 % IV SOLN
INTRAVENOUS | Status: DC | PRN
  Administered 2018-01-24: 6 ug/kg/min via INTRAVENOUS

## 2018-01-24 MED ORDER — PHENOL 1.4 % MT LIQD
1.0000 | OROMUCOSAL | Status: DC | PRN
  Filled 2018-01-24: qty 177

## 2018-01-24 MED ORDER — BUPIVACAINE HCL (PF) 0.5 % IJ SOLN
INTRAMUSCULAR | Status: DC | PRN
  Administered 2018-01-24: 2.6 mL

## 2018-01-24 MED ORDER — TRANEXAMIC ACID-NACL 1000-0.7 MG/100ML-% IV SOLN
1000.0000 mg | Freq: Once | INTRAVENOUS | Status: AC
  Administered 2018-01-24: 1000 mg via INTRAVENOUS
  Filled 2018-01-24: qty 100

## 2018-01-24 MED ORDER — ONDANSETRON HCL 4 MG/2ML IJ SOLN: 4.0000 mg | Freq: Four times a day (QID) | INTRAMUSCULAR | Status: DC | PRN

## 2018-01-24 MED ORDER — BUDESONIDE 0.5 MG/2ML IN SUSP
0.5000 mg | Freq: Two times a day (BID) | RESPIRATORY_TRACT | Status: DC
  Filled 2018-01-24: qty 2

## 2018-01-24 MED ORDER — FAMOTIDINE 20 MG PO TABS
20.0000 mg | ORAL_TABLET | Freq: Once | ORAL | Status: AC
  Administered 2018-01-24: 20 mg via ORAL

## 2018-01-24 MED ORDER — OXYCODONE HCL 5 MG PO TABS
5.0000 mg | ORAL_TABLET | ORAL | Status: DC | PRN
  Administered 2018-01-24 – 2018-01-26 (×5): 5 mg via ORAL
  Filled 2018-01-24 (×6): qty 1

## 2018-01-24 MED ORDER — GABAPENTIN 300 MG PO CAPS
300.0000 mg | ORAL_CAPSULE | Freq: Once | ORAL | Status: AC
  Administered 2018-01-24: 300 mg via ORAL

## 2018-01-24 MED ORDER — MAGNESIUM HYDROXIDE 400 MG/5ML PO SUSP
30.0000 mL | Freq: Every day | ORAL | Status: DC
  Administered 2018-01-24 – 2018-01-25 (×2): 30 mL via ORAL
  Filled 2018-01-24 (×3): qty 30

## 2018-01-24 MED ORDER — DEXAMETHASONE SODIUM PHOSPHATE 10 MG/ML IJ SOLN
INTRAMUSCULAR | Status: AC
  Administered 2018-01-24: 8 mg via INTRAVENOUS
  Filled 2018-01-24: qty 1

## 2018-01-24 MED ORDER — BUDESONIDE 0.5 MG/2ML IN SUSP: 0.5000 mg | Freq: Two times a day (BID) | RESPIRATORY_TRACT | Status: DC | PRN

## 2018-01-24 MED ORDER — CHLORHEXIDINE GLUCONATE 4 % EX LIQD: 60.0000 mL | Freq: Once | CUTANEOUS | Status: DC

## 2018-01-24 MED ORDER — CLINDAMYCIN PHOSPHATE 600 MG/50ML IV SOLN
600.0000 mg | Freq: Four times a day (QID) | INTRAVENOUS | Status: AC
  Administered 2018-01-24 – 2018-01-25 (×4): 600 mg via INTRAVENOUS
  Filled 2018-01-24 (×4): qty 50

## 2018-01-24 MED ORDER — PROMETHAZINE HCL 25 MG/ML IJ SOLN: 6.2500 mg | INTRAMUSCULAR | Status: DC | PRN

## 2018-01-24 MED ORDER — HYDROMORPHONE HCL 1 MG/ML IJ SOLN: 0.2500 mg | INTRAMUSCULAR | Status: DC | PRN

## 2018-01-24 MED ORDER — SENNOSIDES-DOCUSATE SODIUM 8.6-50 MG PO TABS
1.0000 | ORAL_TABLET | Freq: Two times a day (BID) | ORAL | Status: DC
  Administered 2018-01-24 – 2018-01-25 (×3): 1 via ORAL
  Filled 2018-01-24 (×4): qty 1

## 2018-01-24 MED ORDER — PROPOFOL 500 MG/50ML IV EMUL
INTRAVENOUS | Status: DC | PRN
  Administered 2018-01-24: 60 ug/kg/min via INTRAVENOUS

## 2018-01-24 MED ORDER — ONDANSETRON HCL 4 MG PO TABS: 4.0000 mg | ORAL_TABLET | Freq: Four times a day (QID) | ORAL | Status: DC | PRN

## 2018-01-24 MED ORDER — CLINDAMYCIN PHOSPHATE 900 MG/50ML IV SOLN
INTRAVENOUS | Status: AC
  Filled 2018-01-24: qty 50

## 2018-01-24 MED ORDER — KETAMINE HCL 50 MG/ML IJ SOLN
INTRAMUSCULAR | Status: DC | PRN
  Administered 2018-01-24 (×3): 1.6 mg via INTRAMUSCULAR

## 2018-01-24 MED ORDER — CELECOXIB 200 MG PO CAPS
ORAL_CAPSULE | ORAL | Status: AC
  Administered 2018-01-24: 400 mg via ORAL
  Filled 2018-01-24: qty 2

## 2018-01-24 MED ORDER — GLYCOPYRROLATE 0.2 MG/ML IJ SOLN
INTRAMUSCULAR | Status: AC
  Filled 2018-01-24: qty 1

## 2018-01-24 MED ORDER — LORATADINE 10 MG PO TABS: 10.0000 mg | ORAL_TABLET | Freq: Every day | ORAL | Status: DC | PRN

## 2018-01-24 MED ORDER — CLINDAMYCIN PHOSPHATE 900 MG/50ML IV SOLN
INTRAVENOUS | Status: DC | PRN
  Administered 2018-01-24: 900 mg via INTRAVENOUS

## 2018-01-24 MED ORDER — ACETAMINOPHEN 10 MG/ML IV SOLN
INTRAVENOUS | Status: DC | PRN
  Administered 2018-01-24: 1000 mg via INTRAVENOUS

## 2018-01-24 MED ORDER — FERROUS SULFATE 325 (65 FE) MG PO TABS
325.0000 mg | ORAL_TABLET | Freq: Two times a day (BID) | ORAL | Status: DC
  Administered 2018-01-24 – 2018-01-26 (×4): 325 mg via ORAL
  Filled 2018-01-24 (×4): qty 1

## 2018-01-24 MED ORDER — MIDAZOLAM HCL 5 MG/5ML IJ SOLN
INTRAMUSCULAR | Status: DC | PRN
  Administered 2018-01-24: 2 mg via INTRAVENOUS

## 2018-01-24 MED ORDER — OXYCODONE HCL 5 MG PO TABS
10.0000 mg | ORAL_TABLET | ORAL | Status: DC | PRN
  Administered 2018-01-24: 10 mg via ORAL
  Filled 2018-01-24: qty 2

## 2018-01-24 MED ORDER — OMEGA-3-ACID ETHYL ESTERS 1 G PO CAPS
2000.0000 mg | ORAL_CAPSULE | Freq: Every evening | ORAL | Status: DC
  Administered 2018-01-24 – 2018-01-25 (×2): 2000 mg via ORAL
  Filled 2018-01-24 (×2): qty 2

## 2018-01-24 MED ORDER — ACETAMINOPHEN 325 MG PO TABS: 325.0000 mg | ORAL_TABLET | Freq: Four times a day (QID) | ORAL | Status: DC | PRN

## 2018-01-24 MED ORDER — GABAPENTIN 300 MG PO CAPS
ORAL_CAPSULE | ORAL | Status: AC
  Administered 2018-01-24: 300 mg via ORAL
  Filled 2018-01-24: qty 1

## 2018-01-24 MED ORDER — LEVOTHYROXINE SODIUM 112 MCG PO TABS
112.0000 ug | ORAL_TABLET | Freq: Every day | ORAL | Status: DC
  Administered 2018-01-25 – 2018-01-26 (×2): 112 ug via ORAL
  Filled 2018-01-24 (×2): qty 1

## 2018-01-24 MED ORDER — FLEET ENEMA 7-19 GM/118ML RE ENEM: 1.0000 | ENEMA | Freq: Once | RECTAL | Status: DC | PRN

## 2018-01-24 MED ORDER — PRAVASTATIN SODIUM 20 MG PO TABS
40.0000 mg | ORAL_TABLET | Freq: Every evening | ORAL | Status: DC
  Administered 2018-01-24 – 2018-01-25 (×2): 40 mg via ORAL
  Filled 2018-01-24 (×2): qty 2

## 2018-01-24 MED ORDER — MIDAZOLAM HCL 2 MG/2ML IJ SOLN
INTRAMUSCULAR | Status: AC
  Filled 2018-01-24: qty 2

## 2018-01-24 MED ORDER — TRIAMTERENE-HCTZ 37.5-25 MG PO TABS
1.0000 | ORAL_TABLET | Freq: Every day | ORAL | Status: DC
  Administered 2018-01-25 – 2018-01-26 (×2): 1 via ORAL
  Filled 2018-01-24 (×3): qty 1

## 2018-01-24 SURGICAL SUPPLY — 72 items
ATTUNE MED DOME PAT 38 KNEE (Knees) ×2 IMPLANT
ATTUNE MED DOME PAT 38MM KNEE (Knees) ×1 IMPLANT
ATTUNE PSFEM RTSZ5 NARCEM KNEE (Femur) ×3 IMPLANT
ATTUNE PSRP INSR SZ5 5 KNEE (Insert) ×2 IMPLANT
ATTUNE PSRP INSR SZ5 5MM KNEE (Insert) ×1 IMPLANT
BASEPLATE TIBIAL ROTATING SZ 4 (Knees) ×3 IMPLANT
BATTERY INSTRU NAVIGATION (MISCELLANEOUS) ×12 IMPLANT
BLADE SAW 70X12.5 (BLADE) ×3 IMPLANT
BLADE SAW 90X13X1.19 OSCILLAT (BLADE) ×3 IMPLANT
BLADE SAW 90X25X1.19 OSCILLAT (BLADE) ×3 IMPLANT
CANISTER SUCT 1200ML W/VALVE (MISCELLANEOUS) ×3 IMPLANT
CANISTER SUCT 3000ML PPV (MISCELLANEOUS) ×6 IMPLANT
CEMENT HV SMART SET (Cement) ×6 IMPLANT
COOLER POLAR GLACIER W/PUMP (MISCELLANEOUS) ×3 IMPLANT
COVER WAND RF STERILE (DRAPES) IMPLANT
CUFF TOURN 24 STER (MISCELLANEOUS) IMPLANT
CUFF TOURN 30 STER DUAL PORT (MISCELLANEOUS) ×3 IMPLANT
DRAPE SHEET LG 3/4 BI-LAMINATE (DRAPES) ×3 IMPLANT
DRSG DERMACEA 8X12 NADH (GAUZE/BANDAGES/DRESSINGS) ×3 IMPLANT
DRSG OPSITE POSTOP 4X14 (GAUZE/BANDAGES/DRESSINGS) ×3 IMPLANT
DRSG TEGADERM 4X4.75 (GAUZE/BANDAGES/DRESSINGS) ×3 IMPLANT
DURAPREP 26ML APPLICATOR (WOUND CARE) ×6 IMPLANT
ELECT CAUTERY BLADE 6.4 (BLADE) ×3 IMPLANT
ELECT REM PT RETURN 9FT ADLT (ELECTROSURGICAL) ×3
ELECTRODE REM PT RTRN 9FT ADLT (ELECTROSURGICAL) ×1 IMPLANT
EX-PIN ORTHOLOCK NAV 4X150 (PIN) ×6 IMPLANT
GLOVE BIOGEL M STRL SZ7.5 (GLOVE) ×6 IMPLANT
GLOVE BIOGEL PI IND STRL 7.5 (GLOVE) ×5 IMPLANT
GLOVE BIOGEL PI IND STRL 9 (GLOVE) ×1 IMPLANT
GLOVE BIOGEL PI INDICATOR 7.5 (GLOVE) ×10
GLOVE BIOGEL PI INDICATOR 9 (GLOVE) ×2
GLOVE INDICATOR 8.0 STRL GRN (GLOVE) ×3 IMPLANT
GLOVE SURG SYN 9.0  PF PI (GLOVE) ×4
GLOVE SURG SYN 9.0 PF PI (GLOVE) ×2 IMPLANT
GOWN STRL REUS W/ TWL LRG LVL3 (GOWN DISPOSABLE) ×3 IMPLANT
GOWN STRL REUS W/TWL 2XL LVL3 (GOWN DISPOSABLE) ×3 IMPLANT
GOWN STRL REUS W/TWL LRG LVL3 (GOWN DISPOSABLE) ×6
HEMOVAC 400CC 10FR (MISCELLANEOUS) ×3 IMPLANT
HOLDER FOLEY CATH W/STRAP (MISCELLANEOUS) ×3 IMPLANT
HOOD PEEL AWAY FLYTE STAYCOOL (MISCELLANEOUS) ×6 IMPLANT
KIT TURNOVER KIT A (KITS) ×3 IMPLANT
KNIFE SCULPS 14X20 (INSTRUMENTS) ×3 IMPLANT
LABEL OR SOLS (LABEL) ×3 IMPLANT
NDL SAFETY ECLIPSE 18X1.5 (NEEDLE) ×1 IMPLANT
NEEDLE HYPO 18GX1.5 SHARP (NEEDLE) ×2
NEEDLE SPNL 20GX3.5 QUINCKE YW (NEEDLE) ×6 IMPLANT
NS IRRIG 500ML POUR BTL (IV SOLUTION) ×3 IMPLANT
PACK TOTAL KNEE (MISCELLANEOUS) ×3 IMPLANT
PAD WRAPON POLAR KNEE (MISCELLANEOUS) ×1 IMPLANT
PENCIL SMOKE ULTRAEVAC 22 CON (MISCELLANEOUS) ×3 IMPLANT
PIN DRILL QUICK PACK ×3 IMPLANT
PIN FIXATION 1/8DIA X 3INL (PIN) ×9 IMPLANT
PULSAVAC PLUS IRRIG FAN TIP (DISPOSABLE) ×3
SOL .9 NS 3000ML IRR  AL (IV SOLUTION) ×2
SOL .9 NS 3000ML IRR UROMATIC (IV SOLUTION) ×1 IMPLANT
SOL PREP PVP 2OZ (MISCELLANEOUS) ×3
SOLUTION PREP PVP 2OZ (MISCELLANEOUS) ×1 IMPLANT
SPONGE DRAIN TRACH 4X4 STRL 2S (GAUZE/BANDAGES/DRESSINGS) ×3 IMPLANT
STAPLER SKIN PROX 35W (STAPLE) ×3 IMPLANT
STRAP TIBIA SHORT (MISCELLANEOUS) ×3 IMPLANT
SUCTION FRAZIER HANDLE 10FR (MISCELLANEOUS) ×2
SUCTION TUBE FRAZIER 10FR DISP (MISCELLANEOUS) ×1 IMPLANT
SUT VIC AB 0 CT1 36 (SUTURE) ×3 IMPLANT
SUT VIC AB 1 CT1 36 (SUTURE) ×6 IMPLANT
SUT VIC AB 2-0 CT2 27 (SUTURE) ×3 IMPLANT
SYR 20CC LL (SYRINGE) ×3 IMPLANT
SYR 30ML LL (SYRINGE) ×6 IMPLANT
TIP FAN IRRIG PULSAVAC PLUS (DISPOSABLE) ×1 IMPLANT
TOWEL OR 17X26 4PK STRL BLUE (TOWEL DISPOSABLE) ×3 IMPLANT
TOWER CARTRIDGE SMART MIX (DISPOSABLE) ×3 IMPLANT
TRAY FOLEY MTR SLVR 16FR STAT (SET/KITS/TRAYS/PACK) ×3 IMPLANT
WRAPON POLAR PAD KNEE (MISCELLANEOUS) ×3

## 2018-01-24 NOTE — Transfer of Care (Signed)
Immediate Anesthesia Transfer of Care Note  Patient: Lori HighmanJennie Jenkins Kidd  Procedure(s) Performed: COMPUTER ASSISTED TOTAL KNEE ARTHROPLASTY (Right Knee)  Patient Location: PACU  Anesthesia Type:Spinal  Level of Consciousness: drowsy  Airway & Oxygen Therapy: Patient Spontanous Breathing and Patient connected to nasal cannula oxygen  Post-op Assessment: Report given to RN and Post -op Vital signs reviewed and stable  Post vital signs: Reviewed and stable  Last Vitals:  Vitals Value Taken Time  BP 95/45 01/24/2018  2:18 PM  Temp    Pulse 76 01/24/2018  2:19 PM  Resp 11 01/24/2018  2:19 PM  SpO2 96 % 01/24/2018  2:19 PM  Vitals shown include unvalidated device data.  Last Pain:  Vitals:   01/24/18 0959  TempSrc: Oral  PainSc: 0-No pain         Complications: No apparent anesthesia complications

## 2018-01-24 NOTE — Op Note (Signed)
OPERATIVE NOTE  DATE OF SURGERY:  01/24/2018  PATIENT NAME:  Lori Kidd   DOB: 07/02/1950  MRN: [id number]  PRE-OPERATIVE DIAGNOSIS: Degenerative arthrosis of the right knee, primary  POST-OPERATIVE DIAGNOSIS:  Same  PROCEDURE:  Right total knee arthroplasty using computer-assisted navigation  SURGEON:  Jena GaussJames P Hooten, Jr. M.D.  ASSISTANT:  Van ClinesJon Wolfe, PA (present and scrubbed throughout the case, critical for assistance with exposure, retraction, instrumentation, and closure)  ANESTHESIA: spinal  ESTIMATED BLOOD LOSS: 50 mL  FLUIDS REPLACED: 1600 mL of crystalloid  TOURNIQUET TIME: 82 minutes  DRAINS: 2 medium Hemovac drains  SOFT TISSUE RELEASES: Anterior cruciate ligament, posterior cruciate ligament, deep medial collateral ligament, patellofemoral ligament  IMPLANTS UTILIZED: DePuy Attune size 5N posterior stabilized femoral component (cemented), size 4 rotating platform tibial component (cemented), 38 mm medialized dome patella (cemented), and a 5 mm stabilized rotating platform polyethylene insert.  INDICATIONS FOR SURGERY: Lori Kidd is a 67 y.o. year old female with a long history of progressive knee pain. X-rays demonstrated severe degenerative changes in tricompartmental fashion. The patient had not seen any significant improvement despite conservative nonsurgical intervention. After discussion of the risks and benefits of surgical intervention, the patient expressed understanding of the risks benefits and agree with plans for total knee arthroplasty.   The risks, benefits, and alternatives were discussed at length including but not limited to the risks of infection, bleeding, nerve injury, stiffness, blood clots, the need for revision surgery, cardiopulmonary complications, among others, and they were willing to proceed.  PROCEDURE IN DETAIL: The patient was brought into the operating room and, after adequate spinal anesthesia was achieved, a  tourniquet was placed on the patient's upper thigh. The patient's knee and leg were cleaned and prepped with alcohol and DuraPrep and draped in the usual sterile fashion. A "timeout" was performed as per usual protocol. The lower extremity was exsanguinated using an Esmarch, and the tourniquet was inflated to 300 mmHg. An anterior longitudinal incision was made followed by a standard mid vastus approach. The deep fibers of the medial collateral ligament were elevated in a subperiosteal fashion off of the medial flare of the tibia so as to maintain a continuous soft tissue sleeve. The patella was subluxed laterally and the patellofemoral ligament was incised. Inspection of the knee demonstrated severe degenerative changes with full-thickness loss of articular cartilage. Osteophytes were debrided using a rongeur. Anterior and posterior cruciate ligaments were excised. Two 4.0 mm Schanz pins were inserted in the femur and into the tibia for attachment of the array of trackers used for computer-assisted navigation. Hip center was identified using a circumduction technique. Distal landmarks were mapped using the computer. The distal femur and proximal tibia were mapped using the computer. The distal femoral cutting guide was positioned using computer-assisted navigation so as to achieve a 5 distal valgus cut. The femur was sized and it was felt that a size 5N femoral component was appropriate. A size 5 femoral cutting guide was positioned and the anterior cut was performed and verified using the computer. This was followed by completion of the posterior and chamfer cuts. Femoral cutting guide for the central box was then positioned in the center box cut was performed.  Attention was then directed to the proximal tibia. Medial and lateral menisci were excised. The extramedullary tibial cutting guide was positioned using computer-assisted navigation so as to achieve a 0 varus-valgus alignment and 3 posterior slope.  The cut was performed and verified using the computer. The proximal tibia  was sized and it was felt that a size 4 tibial tray was appropriate. Tibial and femoral trials were inserted followed by insertion of a 5 mm polyethylene insert. This allowed for excellent mediolateral soft tissue balancing both in flexion and in full extension. Finally, the patella was cut and prepared so as to accommodate a 38 mm medialized dome patella. A patella trial was placed and the knee was placed through a range of motion with excellent patellar tracking appreciated. The femoral trial was removed after debridement of posterior osteophytes. The central post-hole for the tibial component was reamed followed by insertion of a keel punch. Tibial trials were then removed. Cut surfaces of bone were irrigated with copious amounts of normal saline with antibiotic solution using pulsatile lavage and then suctioned dry. Polymethylmethacrylate cement was prepared in the usual fashion using a vacuum mixer. Cement was applied to the cut surface of the proximal tibia as well as along the undersurface of a size 4 rotating platform tibial component. Tibial component was positioned and impacted into place. Excess cement was removed using Personal assistantreer elevators. Cement was then applied to the cut surfaces of the femur as well as along the posterior flanges of the size 5N femoral component. The femoral component was positioned and impacted into place. Excess cement was removed using Personal assistantreer elevators. A 5 mm polyethylene trial was inserted and the knee was brought into full extension with steady axial compression applied. Finally, cement was applied to the backside of a 38 mm medialized dome patella and the patellar component was positioned and patellar clamp applied. Excess cement was removed using Personal assistantreer elevators. After adequate curing of the cement, the tourniquet was deflated after a total tourniquet time of 82 minutes. Hemostasis was achieved using  electrocautery. The knee was irrigated with copious amounts of normal saline with antibiotic solution using pulsatile lavage and then suctioned dry. 20 mL of 1.3% Exparel and 60 mL of 0.25% Marcaine in 40 mL of normal saline was injected along the posterior capsule, medial and lateral gutters, and along the arthrotomy site. A 5 mm stabilized rotating platform polyethylene insert was inserted and the knee was placed through a range of motion with excellent mediolateral soft tissue balancing appreciated and excellent patellar tracking noted. 2 medium drains were placed in the wound bed and brought out through separate stab incisions. The medial parapatellar portion of the incision was reapproximated using interrupted sutures of #1 Vicryl. Subcutaneous tissue was approximated in layers using first #0 Vicryl followed #2-0 Vicryl. The skin was approximated with skin staples. A sterile dressing was applied.  The patient tolerated the procedure well and was transported to the recovery room in stable condition.    James P. Angie FavaHooten, Jr., M.D.

## 2018-01-24 NOTE — H&P (Signed)
The patient has been re-examined, and the chart reviewed, and there have been no interval changes to the documented history and physical.    The risks, benefits, and alternatives have been discussed at length. The patient expressed understanding of the risks benefits and agreed with plans for surgical intervention.  Navayah Sok P. Claudy Abdallah, Jr. M.D.    

## 2018-01-24 NOTE — Anesthesia Postprocedure Evaluation (Signed)
Anesthesia Post Note  Patient: Lori HighmanJennie Jenkins Kidd  Procedure(s) Performed: COMPUTER ASSISTED TOTAL KNEE ARTHROPLASTY (Right Knee)  Patient location during evaluation: PACU Anesthesia Type: Spinal Level of consciousness: awake and alert Pain management: pain level controlled Vital Signs Assessment: post-procedure vital signs reviewed and stable Respiratory status: spontaneous breathing, nonlabored ventilation, respiratory function stable and patient connected to nasal cannula oxygen Cardiovascular status: blood pressure returned to baseline and stable Postop Assessment: no apparent nausea or vomiting Anesthetic complications: no     Last Vitals:  Vitals:   01/24/18 1518 01/24/18 1533  BP: (!) 105/57 113/65  Pulse: 74 70  Resp: (!) 9 11  Temp:  36.4 C  SpO2: 97% 98%    Last Pain:  Vitals:   01/24/18 1533  TempSrc:   PainSc: 0-No pain                 Jovita GammaKathryn L Fitzgerald

## 2018-01-24 NOTE — Addendum Note (Signed)
Addendum  created 01/24/18 1557 by Jovita GammaFitzgerald, Aaliya Maultsby L, MD   Attestation recorded in Intraprocedure, Intraprocedure Attestations filed

## 2018-01-24 NOTE — Progress Notes (Signed)
Pt admitted to unit from PACU. Pt is A&Ox4, VSS. Surgical dressing CDI, bone foam applied. Foley draining and hemovac to suction. Pt oriented to room and plan of care. Bed in lowest position, call bell within reach.

## 2018-01-24 NOTE — Anesthesia Preprocedure Evaluation (Addendum)
Anesthesia Evaluation  Patient identified by MRN, date of birth, ID band Patient awake    Reviewed: Allergy & Precautions, H&P , NPO status , Patient's Chart, lab work & pertinent test results  Airway Mallampati: III       Dental  (+) Teeth Intact   Pulmonary asthma , former smoker,           Cardiovascular hypertension,      Neuro/Psych negative neurological ROS  negative psych ROS   GI/Hepatic negative GI ROS, Neg liver ROS,   Endo/Other  Hypothyroidism   Renal/GU      Musculoskeletal  (+) Arthritis ,   Abdominal   Peds  Hematology negative hematology ROS (+)   Anesthesia Other Findings Past Medical History: No date: Arthritis No date: Asthma No date: High blood pressure No date: High cholesterol No date: Hypothyroidism No date: Pneumonia  Past Surgical History: 1982: APPENDECTOMY 1998: SIGMOID RESECTION / RECTOPEXY 1962: TONSILLECTOMY 1983: TUBAL LIGATION 2000: VAGINAL HYSTERECTOMY     Reproductive/Obstetrics negative OB ROS                           Anesthesia Physical Anesthesia Plan  ASA: II  Anesthesia Plan: Spinal and General   Post-op Pain Management:    Induction:   PONV Risk Score and Plan: Ondansetron and Propofol infusion  Airway Management Planned: Simple Face Mask and Natural Airway  Additional Equipment:   Intra-op Plan:   Post-operative Plan:   Informed Consent: I have reviewed the patients History and Physical, chart, labs and discussed the procedure including the risks, benefits and alternatives for the proposed anesthesia with the patient or authorized representative who has indicated his/her understanding and acceptance.   Dental Advisory Given  Plan Discussed with: Anesthesiologist, CRNA and Surgeon  Anesthesia Plan Comments:         Anesthesia Quick Evaluation

## 2018-01-24 NOTE — Anesthesia Post-op Follow-up Note (Signed)
Anesthesia QCDR form completed.        

## 2018-01-24 NOTE — NC FL2 (Signed)
[place] MEDICAID FL2 LEVEL OF CARE SCREENING TOOL     IDENTIFICATION  Patient Name: Lori Kidd Birthdate: 1950/02/16 Sex: female Admission Date (Current Location): 01/24/2018  [place] Number:  Chiropodist and Address:  [facility], [address], [place], [place] [address]      Provider Number: [id number]  Attending Physician Name and Address:  Donato Heinz, MD  Relative Name and Phone Number:       Current Level of Care: Hospital Recommended Level of Care: Skilled Nursing Facility Prior Approval Number:    Date Approved/Denied:   PASRR Number: [id number])  Discharge Plan: SNF    Current Diagnoses: Patient Active Problem List   Diagnosis Date Noted  . Total knee replacement status 01/24/2018  . Class 1 obesity due to excess calories without serious comorbidity with body mass index (BMI) of 30.0 to 30.9 in adult 10/06/2017  . Acquired hypothyroidism 06/22/2017  . Allergic rhinitis 06/22/2017  . Essential hypertension 06/22/2017  . Mixed hyperlipidemia 06/22/2017  . Primary osteoarthritis of right knee 08/30/2016  . Mild persistent asthma with acute exacerbation 11/15/2013    Orientation RESPIRATION BLADDER Height & Weight     Self, Time, Situation, Place  Normal Continent Weight: 174 lb 8 oz (79.2 kg) Height:  5\' 3"  (160 cm)  BEHAVIORAL SYMPTOMS/MOOD NEUROLOGICAL BOWEL NUTRITION STATUS      Continent Diet(Diet: Regular )  AMBULATORY STATUS COMMUNICATION OF NEEDS Skin   Extensive Assist Verbally Surgical wounds(Incision: Right Knee. )                       Personal Care Assistance Level of Assistance  Bathing, Feeding, Dressing Bathing Assistance: Limited assistance Feeding assistance: Independent Dressing Assistance: Limited assistance     Functional Limitations Info  Sight, Hearing, Speech Sight Info: Adequate Hearing Info: Adequate Speech Info: Adequate    SPECIAL CARE FACTORS  FREQUENCY  PT (By licensed PT), OT (By licensed OT)     PT Frequency: (5) OT Frequency: (5)            Contractures      Additional Factors Info  Code Status, Allergies Code Status Info: (Full Code. ) Allergies Info: (Ace Inhibitors, Penicillins)           Current Medications (01/24/2018):  This is the current hospital active medication list Current Facility-Administered Medications  Medication Dose Route Frequency Provider Last Rate Last Dose  . 0.9 %  sodium chloride infusion   Intravenous Continuous Hooten, Illene Labrador, MD      . acetaminophen (OFIRMEV) IV 1,000 mg  1,000 mg Intravenous Q6H Hooten, Illene Labrador, MD      . Melene Muller ON 01/25/2018] acetaminophen (TYLENOL) tablet 325-650 mg  325-650 mg Oral Q6H PRN Hooten, Illene Labrador, MD      . albuterol (PROVENTIL) (2.5 MG/3ML) 0.083% nebulizer solution 3 mL  3 mL Inhalation Q6H PRN Hooten, Illene Labrador, MD      . alum & mag hydroxide-simeth (MAALOX/MYLANTA) 200-200-20 MG/5ML suspension 30 mL  30 mL Oral Q4H PRN Hooten, Illene Labrador, MD      . bisacodyl (DULCOLAX) suppository 10 mg  10 mg Rectal Daily PRN Hooten, Illene Labrador, MD      . budesonide (PULMICORT) nebulizer solution 0.5 mg  0.5 mg Nebulization BID Hooten, Illene Labrador, MD      . ceFAZolin (ANCEF) 2-4 GM/100ML-% IVPB           . celecoxib (CELEBREX) capsule 200  mg  200 mg Oral BID Hooten, Illene LabradorJames P, MD      . clindamycin (CLEOCIN) IVPB 600 mg  600 mg Intravenous Q6H Hooten, Illene LabradorJames P, MD      . diphenhydrAMINE (BENADRYL) 12.5 MG/5ML elixir 12.5-25 mg  12.5-25 mg Oral Q4H PRN Hooten, Illene LabradorJames P, MD      . Melene Muller[START ON 01/25/2018] enoxaparin (LOVENOX) injection 30 mg  30 mg Subcutaneous Q12H Hooten, Illene LabradorJames P, MD      . ferrous sulfate tablet 325 mg  325 mg Oral BID WC Hooten, Illene LabradorJames P, MD      . fluticasone (FLONASE) 50 MCG/ACT nasal spray 1 spray  1 spray Each Nare Daily Hooten, Illene LabradorJames P, MD      . gabapentin (NEURONTIN) capsule 300 mg  300 mg Oral QHS Hooten, Illene LabradorJames P, MD      . HYDROmorphone (DILAUDID) injection  0.5-1 mg  0.5-1 mg Intravenous Q4H PRN Hooten, Illene LabradorJames P, MD      . Melene Muller[START ON 01/25/2018] levothyroxine (SYNTHROID, LEVOTHROID) tablet 112 mcg  112 mcg Oral QAC breakfast Hooten, Illene LabradorJames P, MD      . loratadine (CLARITIN) tablet 10 mg  10 mg Oral Daily PRN Hooten, Illene LabradorJames P, MD      . magnesium hydroxide (MILK OF MAGNESIA) suspension 30 mL  30 mL Oral Daily Hooten, Illene LabradorJames P, MD      . menthol-cetylpyridinium (CEPACOL) lozenge 3 mg  1 lozenge Oral PRN Hooten, Illene LabradorJames P, MD       Or  . phenol (CHLORASEPTIC) mouth spray 1 spray  1 spray Mouth/Throat PRN Hooten, Illene LabradorJames P, MD      . metoCLOPramide (REGLAN) tablet 5-10 mg  5-10 mg Oral Q8H PRN Hooten, Illene LabradorJames P, MD       Or  . metoCLOPramide (REGLAN) injection 5-10 mg  5-10 mg Intravenous Q8H PRN Hooten, Illene LabradorJames P, MD      . metoCLOPramide (REGLAN) tablet 10 mg  10 mg Oral TID AC & HS Hooten, Illene LabradorJames P, MD      . Omega-3 CAPS 2,000 mg  2,000 mg Oral Daily Hooten, Illene LabradorJames P, MD      . ondansetron (ZOFRAN) tablet 4 mg  4 mg Oral Q6H PRN Hooten, Illene LabradorJames P, MD       Or  . ondansetron (ZOFRAN) injection 4 mg  4 mg Intravenous Q6H PRN Hooten, Illene LabradorJames P, MD      . oxyCODONE (Oxy IR/ROXICODONE) immediate release tablet 10 mg  10 mg Oral Q4H PRN Hooten, Illene LabradorJames P, MD      . oxyCODONE (Oxy IR/ROXICODONE) immediate release tablet 5 mg  5 mg Oral Q4H PRN Hooten, Illene LabradorJames P, MD   5 mg at 01/24/18 1635  . pantoprazole (PROTONIX) EC tablet 40 mg  40 mg Oral BID Hooten, Illene LabradorJames P, MD      . pravastatin (PRAVACHOL) tablet 40 mg  40 mg Oral Daily Hooten, Illene LabradorJames P, MD      . senna-docusate (Senokot-S) tablet 1 tablet  1 tablet Oral BID Hooten, Illene LabradorJames P, MD      . sodium phosphate (FLEET) 7-19 GM/118ML enema 1 enema  1 enema Rectal Once PRN Hooten, Illene LabradorJames P, MD      . traMADol (ULTRAM) tablet 50-100 mg  50-100 mg Oral Q4H PRN Hooten, Illene LabradorJames P, MD      . triamterene-hydrochlorothiazide (MAXZIDE-25) 37.5-25 MG per tablet 1 tablet  1 tablet Oral Daily Hooten, Illene LabradorJames P, MD         Discharge  Medications: Please  see discharge summary for a list of discharge medications.  Relevant Imaging Results:  Relevant Lab Results:   Additional Information (SSN: [id number])  Kyzer Blowe, Darleen CrockerBailey M, LCSW

## 2018-01-24 NOTE — Anesthesia Procedure Notes (Signed)
Spinal  Patient location during procedure: OR Staffing Resident/CRNA: Bernardo Heater, CRNA Performed: resident/CRNA  Preanesthetic Checklist Completed: patient identified, site marked, surgical consent, pre-op evaluation, timeout performed, IV checked, risks and benefits discussed and monitors and equipment checked Spinal Block Patient position: sitting Prep: ChloraPrep Patient monitoring: heart rate, continuous pulse ox, blood pressure and cardiac monitor Approach: left paramedian Location: L3-4 Injection technique: single-shot Needle Needle type: Introducer and Pencan  Needle gauge: 24 G Needle length: 9 cm Additional Notes Negative paresthesia. Negative blood return. Positive free-flowing CSF. Expiration date of kit checked and confirmed. Patient tolerated procedure well, without complications.

## 2018-01-25 ENCOUNTER — Encounter: Payer: Self-pay | Admitting: Orthopedic Surgery

## 2018-01-25 MED ORDER — TRAMADOL HCL 50 MG PO TABS: 50.0000 mg | ORAL_TABLET | Freq: Four times a day (QID) | ORAL | 0 refills | Status: DC | PRN

## 2018-01-25 MED ORDER — OXYCODONE HCL 5 MG PO TABS: 5.0000 mg | ORAL_TABLET | ORAL | 0 refills | Status: DC | PRN

## 2018-01-25 MED ORDER — ENOXAPARIN SODIUM 40 MG/0.4ML ~~LOC~~ SOLN: 40.0000 mg | SUBCUTANEOUS | 0 refills | Status: DC

## 2018-01-25 NOTE — Progress Notes (Signed)
Physical Therapy Treatment Patient Details Name: Lori HighmanJennie Jenkins Kidd MRN: [id number] DOB: 01/18/1951 Today's Date: 01/25/2018    History of Present Illness 67yo female s/p R TKA 01/14/18.    PT Comments    Pt again did well with PT session and showed ability to ambulate with consistent cadence, good speed and minimal UE use.  She did have some dizziness after stair negotiation and needed to sit (BP as stable after briefly laying down, did not drop getting back to sitting).  She has very good ROM and quad control and overall is doing well.   Follow Up Recommendations  Home health PT     Equipment Recommendations  Rolling walker with 5" wheels    Recommendations for Other Services       Precautions / Restrictions Precautions Precautions: Fall Restrictions RLE Weight Bearing: Weight bearing as tolerated    Mobility  Bed Mobility Overal bed mobility: Modified Independent             General bed mobility comments: Pt able to get up to sitting and back to supine w/o phyiscal assist  Transfers Overall transfer level: Modified independent Equipment used: Rolling walker (2 wheeled) Transfers: Sit to/from Stand Sit to Stand: Supervision         General transfer comment: no phyiscal assist needed, minimal reminders for set up, sequencing, placement  Ambulation/Gait Ambulation/Gait assistance: Supervision Gait Distance (Feet): 250 Feet Assistive device: Rolling walker (2 wheeled)       General Gait Details: Pt again easily able to maintain consistent cadence with light UE use and good overall confidence.   Stairs Stairs: Yes Stairs assistance: Modified independent (Device/Increase time) Stair Management: One rail Right;No rails;Backwards;Sideways Number of Stairs: 4 General stair comments: Pt was able to negotiate up 2 steps backwards with walker, then was also able to ascend 4 steps with single rail use   Wheelchair Mobility    Modified Rankin (Stroke  Patients Only)       Balance Overall balance assessment: Modified Independent                                          Cognition Arousal/Alertness: Awake/alert Behavior During Therapy: WFL for tasks assessed/performed Overall Cognitive Status: Within Functional Limits for tasks assessed                                        Exercises Total Joint Exercises Ankle Circles/Pumps: AROM;10 reps Quad Sets: Strengthening;15 reps Short Arc Quad: Strengthening;AROM;15 reps Heel Slides: Strengthening;10 reps Hip ABduction/ADduction: Strengthening;10 reps Straight Leg Raises: AROM;10 reps;Strengthening Knee Flexion: PROM;5 reps    General Comments        Pertinent Vitals/Pain Pain Score: 5 (during ambulation)    Home Living Family/patient expects to be discharged to:: Private residence Living Arrangements: Spouse/significant other                  Prior Function            PT Goals (current goals can now be found in the care plan section) Progress towards PT goals: Progressing toward goals    Frequency    BID      PT Plan Current plan remains appropriate    Co-evaluation  AM-PAC PT "6 Clicks" Mobility   Outcome Measure  Help needed turning from your back to your side while in a flat bed without using bedrails?: None Help needed moving from lying on your back to sitting on the side of a flat bed without using bedrails?: None Help needed moving to and from a bed to a chair (including a wheelchair)?: None Help needed standing up from a chair using your arms (e.g., wheelchair or bedside chair)?: None Help needed to walk in hospital room?: None Help needed climbing 3-5 steps with a railing? : A Little 6 Click Score: 23    End of Session Equipment Utilized During Treatment: Gait belt Activity Tolerance: Patient tolerated treatment well Patient left: with chair alarm set;with call bell/phone within  reach;with family/visitor present   PT Visit Diagnosis: Muscle weakness (generalized) (M62.81);Difficulty in walking, not elsewhere classified (R26.2)     Time: [id number] PT Time Calculation (min) (ACUTE ONLY): 25 min  Charges:  $Gait Training: 8-22 mins $Therapeutic Exercise: 8-22 mins                     Malachi ProGalen R Christinna Sprung, DPT 01/25/2018, 5:19 PM

## 2018-01-25 NOTE — Discharge Summary (Signed)
Physician Discharge Summary  Patient ID: Lori Kidd MRN: [id number] DOB/AGE: 65/09/1950 65 y.o.  Admit date: 01/24/2018 Discharge date: 01/26/2018  Admission Diagnoses:  OSTEOARTHRITIS OF RIGHT KNEE   Discharge Diagnoses: Patient Active Problem List   Diagnosis Date Noted  . Total knee replacement status 01/24/2018  . Class 1 obesity due to excess calories without serious comorbidity with body mass index (BMI) of 30.0 to 30.9 in adult 10/06/2017  . Acquired hypothyroidism 06/22/2017  . Allergic rhinitis 06/22/2017  . Essential hypertension 06/22/2017  . Mixed hyperlipidemia 06/22/2017  . Primary osteoarthritis of right knee 08/30/2016  . Mild persistent asthma with acute exacerbation 11/15/2013    Past Medical History:  Diagnosis Date  . Arthritis   . Asthma   . High blood pressure   . High cholesterol   . Hypothyroidism   . Pneumonia      Transfusion: No transfusions during this admission   Consultants (if any):   Discharged Condition: Improved  Hospital Course: Lori HighmanJennie Jenkins Provencio is an [age] y.o. female who was admitted 01/24/2018 with a diagnosis of degenerative arthrosis left knee and went to the operating room on 01/24/2018 and underwent the above named procedures.    Surgeries:Procedure(s): COMPUTER ASSISTED TOTAL KNEE ARTHROPLASTY on 01/24/2018  PRE-OPERATIVE DIAGNOSIS: Degenerative arthrosis of the right knee, primary  POST-OPERATIVE DIAGNOSIS:  Same  PROCEDURE:  Right total knee arthroplasty using computer-assisted navigation  SURGEON:  Jena GaussJames P Hooten, Jr. M.D.  ASSISTANT:  Van ClinesJon Wolfe, PA (present and scrubbed throughout the case, critical for assistance with exposure, retraction, instrumentation, and closure)  ANESTHESIA: spinal  ESTIMATED BLOOD LOSS: 50 mL  FLUIDS REPLACED: 1600 mL of crystalloid  TOURNIQUET TIME: 82 minutes  DRAINS: 2 medium Hemovac drains  SOFT TISSUE RELEASES: Anterior cruciate ligament, posterior  cruciate ligament, deep medial collateral ligament, patellofemoral ligament  IMPLANTS UTILIZED: DePuy Attune size 5N posterior stabilized femoral component (cemented), size 4 rotating platform tibial component (cemented), 38 mm medialized dome patella (cemented), and a 5 mm stabilized rotating platform polyethylene insert.  INDICATIONS FOR SURGERY: Lori HighmanJennie Jenkins Dupree is a 67 y.o. year old female with a long history of progressive knee pain. X-rays demonstrated severe degenerative changes in tricompartmental fashion. The patient had not seen any significant improvement despite conservative nonsurgical intervention. After discussion of the risks and benefits of surgical intervention, the patient expressed understanding of the risks benefits and agree with plans for total knee arthroplasty.   The risks, benefits, and alternatives were discussed at length including but not limited to the risks of infection, bleeding, nerve injury, stiffness, blood clots, the need for revision surgery, cardiopulmonary complications, among others, and they were willing to proceed. Patient tolerated the surgery well. No complications .Patient was taken to PACU where she was stabilized and then transferred to the orthopedic floor.  Patient started on Lovenox 30 mg q 12 hrs. Foot pumps applied bilaterally at 80 mm hgb. Heels elevated off bed with rolled towels. No evidence of DVT. Calves non tender. Negative Homan. Physical therapy started on day #1 for gait training and transfer with OT starting on  day #1 for ADL and assisted devices. Patient has done well with therapy. Ambulated greater than 200 feet upon being discharged.  Was able to ascend and descend 4 steps safely and independently  Patient's IV And Foley were discontinued on day #1 with Hemovac being discontinued on day #2. Dressing was changed on day 2 prior to patient being discharged   She was given perioperative antibiotics:  Anti-infectives (From  admission, onward)   Start     Dose/Rate Route Frequency Ordered Stop   01/24/18 1700  clindamycin (CLEOCIN) IVPB 600 mg     600 mg 100 mL/hr over 30 Minutes Intravenous Every 6 hours 01/24/18 1625 01/25/18 1659   01/24/18 1140  gentamicin (GARAMYCIN) 80 mg in sodium chloride 0.9 % 500 mL irrigation  Status:  Discontinued       As needed 01/24/18 1147 01/24/18 1415   01/24/18 0943  clindamycin (CLEOCIN) 900 MG/50ML IVPB    Note to Pharmacy:  Renie OraSlemenda, Debra   : cabinet override      01/24/18 0943 01/24/18 1123   01/24/18 0938  ceFAZolin (ANCEF) 2-4 GM/100ML-% IVPB    Note to Pharmacy:  Renie OraSlemenda, Debra   : cabinet override      01/24/18 0938 01/24/18 2159   01/24/18 0600  clindamycin (CLEOCIN) IVPB 900 mg  Status:  Discontinued     900 mg 100 mL/hr over 30 Minutes Intravenous On call to O.R. 01/23/18 2216 01/24/18 0950    .  She was fitted with AV 1 compression foot pump devices, instructed on heel pumps, early ambulation, and fitted with TED stockings bilaterally for DVT prophylaxis.  She benefited maximally from the hospital stay and there were no complications.    Recent vital signs:  Vitals:   01/24/18 2044 01/25/18 0025  BP: (!) 103/55 (!) 105/57  Pulse: 89 91  Resp: 18 19  Temp: 98.6 F (37 C) 98 F (36.7 C)  SpO2: 98% 100%    Recent laboratory studies:  Lab Results  Component Value Date   HGB 14.7 01/12/2018   HGB 13.9 10/27/2012   Lab Results  Component Value Date   WBC 6.9 01/12/2018   PLT 302 01/12/2018   Lab Results  Component Value Date   INR 0.90 01/12/2018   Lab Results  Component Value Date   NA 139 01/12/2018   K 3.7 01/12/2018   CL 103 01/12/2018   CO2 29 01/12/2018   BUN 25 (H) 01/12/2018   CREATININE 0.81 01/12/2018   GLUCOSE 90 01/12/2018    Discharge Medications:   Allergies as of 01/25/2018      Reactions   Ace Inhibitors Cough   Penicillins Rash, Other (See Comments)   Redness Has patient had a PCN reaction causing immediate  rash, facial/tongue/throat swelling, SOB or lightheadedness with hypotension: Yes Has patient had a PCN reaction causing severe rash involving mucus membranes or skin necrosis: No Has patient had a PCN reaction that required hospitalization: No Has patient had a PCN reaction occurring within the last 10 years: No If all of the above answers are "NO", then may proceed with Cephalosporin use.      Medication List    STOP taking these medications   aspirin EC 81 MG tablet   naproxen sodium 220 MG tablet Commonly known as:  ALEVE     TAKE these medications   albuterol 108 (90 Base) MCG/ACT inhaler Commonly known as:  PROVENTIL HFA;VENTOLIN HFA Inhale 2 puffs into the lungs every 6 (six) hours as needed for wheezing or shortness of breath.   CRANBERRY PO Take 900 mg by mouth daily.   ELDERBERRY PO Take 100 mg by mouth daily.   enoxaparin 40 MG/0.4ML injection Commonly known as:  LOVENOX Inject 0.4 mLs (40 mg total) into the skin daily for 14 days. Start taking on:  January 27, 2018   EPIPEN 2-PAK 0.3 mg/0.3 mL Soaj injection Generic drug:  EPINEPHrine Inject  0.3 mg into the muscle once.   fluticasone 50 MCG/ACT nasal spray Commonly known as:  FLONASE Place 1 spray into both nostrils daily.   Fluticasone Propionate (Inhal) 250 MCG/BLIST Aepb Inhale 1 puff into the lungs daily.   levothyroxine 112 MCG tablet Commonly known as:  SYNTHROID, LEVOTHROID Take 112 mcg by mouth daily before breakfast.   loratadine 10 MG tablet Commonly known as:  CLARITIN Take 10 mg by mouth daily as needed for allergies.   Melatonin 5 MG Caps Take by mouth at bedtime as needed.   Omega-3 1000 MG Caps Take 2,000 mg by mouth daily.   oxyCODONE 5 MG immediate release tablet Commonly known as:  Oxy IR/ROXICODONE Take 1 tablet (5 mg total) by mouth every 4 (four) hours as needed for moderate pain (pain score 4-6).   pravastatin 40 MG tablet Commonly known as:  PRAVACHOL Take 40 mg by mouth  daily.   traMADol 50 MG tablet Commonly known as:  ULTRAM Take 1-2 tablets (50-100 mg total) by mouth every 6 (six) hours as needed for moderate pain.   triamterene-hydrochlorothiazide 37.5-25 MG tablet Commonly known as:  MAXZIDE-25 Take 1 tablet by mouth daily.            Durable Medical Equipment  (From admission, onward)         Start     Ordered   01/24/18 1626  DME Walker rolling  Once    Question:  Patient needs a walker to treat with the following condition  Answer:  Total knee replacement status   01/24/18 1625   01/24/18 1626  DME Bedside commode  Once    Question:  Patient needs a bedside commode to treat with the following condition  Answer:  Total knee replacement status   01/24/18 1625          Diagnostic Studies: Dg Knee Right Port  Result Date: 01/24/2018 CLINICAL DATA:  Right knee replacement. EXAM: PORTABLE RIGHT KNEE - 1-2 VIEW COMPARISON:  None. FINDINGS: The right knee demonstrates a total knee arthroplasty without evidence of hardware failure or complication. There is expected intra-articular air. There is no fracture or dislocation. The alignment is anatomic. Surgical drains are present. Post-surgical changes noted in the surrounding soft tissues. IMPRESSION: Interval right total knee arthroplasty without acute postoperative complication. Electronically Signed   By: Obie Dredge M.D.   On: 01/24/2018 15:00    Disposition:   Discharge Instructions    Diet - low sodium heart healthy   Complete by:  As directed    Increase activity slowly   Complete by:  As directed       Follow-up Information    [name], PA On 02/08/2018.   Specialty:  Physician Assistant Why:  at 9:45am Contact information: [address] [place] [address] [phone]        Donato Heinz, MD On 03/08/2018.   Specialty:  Orthopedic Surgery Why:  at 1:45pm Contact information: [address] [facility] [place]  [id number] [phone]            Signed: [name] 01/25/2018, 8:07 AM

## 2018-01-25 NOTE — Progress Notes (Signed)
Clinical Social Worker (CSW) received SNF consult. PT is recommending home health. RN case manager aware of above. Please reconsult if future social work needs arise. CSW signing off.   Ellyse Rotolo, LCSW [phone] 

## 2018-01-25 NOTE — Evaluation (Signed)
Occupational Therapy Evaluation Patient Details Name: Lori Kidd MRN: [id number] DOB: 11/23/1950 Today's Date: 01/25/2018    History of Present Illness 67yo female s/p R TKA POD#1.   Clinical Impression   Pt seen for OT evaluation this date, POD#1 from above surgery. Pt was independent in all ADLs prior to surgery, however becoming more limited in her ability to perform household tasks like vacuuming 2/2 R knee pain.  Pt is eager to return to PLOF with less pain and improved safety and independence. Pt currently requires additional time/effort to perform LB ADL tasks while in seated position due to pain and limited AROM of R knee. Pt/spouse instructed in polar care mgt, falls prevention strategies, pet care considerations, functional mobility training within kitchen/bathroom environments, home/routines modifications, DME/AE for LB bathing and dressing tasks, and compression stocking mgt. Pt/spouse verbalized understanding of all education/training provided. Do not anticipate any further OT need at this time. Will sign off.    Follow Up Recommendations  No OT follow up    Equipment Recommendations  Other (comment)(reacher)    Recommendations for Other Services       Precautions / Restrictions Precautions Precautions: Fall Restrictions Weight Bearing Restrictions: Yes RLE Weight Bearing: Weight bearing as tolerated      Mobility Bed Mobility Overal bed mobility: Modified Independent             General bed mobility comments: + time  Transfers Overall transfer level: Needs assistance Equipment used: Rolling walker (2 wheeled) Transfers: Sit to/from Stand Sit to Stand: Min guard         General transfer comment: initial instruction in RW use and hand placement with pt able to perform without difficulty    Balance Overall balance assessment: No apparent balance deficits (not formally assessed)                                         ADL  either performed or assessed with clinical judgement   ADL Overall ADL's : Modified independent                                       General ADL Comments: Pt able to perform LB ADL tasks with additional time/effort but did not require AE to complete. Spouse able to manage compression stockings     Vision Baseline Vision/History: Wears glasses Wears Glasses: Reading only Patient Visual Report: No change from baseline       Perception     Praxis      Pertinent Vitals/Pain Pain Assessment: 0-10 Pain Score: 4  Pain Location: R knee during mobility, back down to 2/10 once seated Pain Descriptors / Indicators: Aching Pain Intervention(s): Limited activity within patient's tolerance;Monitored during session;Repositioned;Ice applied;Patient requesting pain meds-RN notified     Hand Dominance     Extremity/Trunk Assessment Upper Extremity Assessment Upper Extremity Assessment: Overall WFL for tasks assessed   Lower Extremity Assessment Lower Extremity Assessment: Overall WFL for tasks assessed(expected post-op strength/ROM deficits in R knee, but minimally functionally limiting)   Cervical / Trunk Assessment Cervical / Trunk Assessment: Normal   Communication Communication Communication: No difficulties   Cognition Arousal/Alertness: Awake/alert Behavior During Therapy: WFL for tasks assessed/performed Overall Cognitive Status: Within Functional Limits for tasks assessed  General Comments       Exercises Other Exercises Other Exercises: pt/spouse educated in compression stocking mgt, polar care mgt, functional mobility training with RW with kitchen/bathroom environments, pet considerations, falls prevention, and AE/DME for ADL to maximize safety/independence - pt/spouse verbalized understanding of all edu/training provided   Shoulder Instructions      Home Living Family/patient expects to be discharged  to:: Private residence Living Arrangements: Spouse/significant other Available Help at Discharge: Family;Available 24 hours/day Type of Home: House Home Access: Stairs to enter Entergy CorporationEntrance Stairs-Number of Steps: 3 + R/L rails in the front; 2 steps w/ no rails from garage Entrance Stairs-Rails: Right;Left Home Layout: Able to live on main level with bedroom/bathroom;Two level     Bathroom Shower/Tub: Producer, television/film/videoWalk-in shower   Bathroom Toilet: Standard     Home Equipment: Shower seat - built in;Bedside commode   Additional Comments: has a wide 2WW that is her father in law's       Prior Functioning/Environment Level of Independence: Independent        Comments: pt independent with mobility, driving, working as a Architectchurch secretary, 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        OT Problem List: Decreased strength;Decreased range of motion;Pain      OT Treatment/Interventions:      OT Goals(Current goals can be found in the care plan section) Acute Rehab OT Goals Patient Stated Goal: to return to PLOF OT Goal Formulation: All assessment and education complete, DC therapy  OT Frequency:     Barriers to D/C:            Co-evaluation              AM-PAC OT "6 Clicks" Daily Activity     Outcome Measure Help from another person eating meals?: None Help from another person taking care of personal grooming?: None Help from another person toileting, which includes using toliet, bedpan, or urinal?: A Little Help from another person bathing (including washing, rinsing, drying)?: None Help from another person to put on and taking off regular upper body clothing?: None Help from another person to put on and taking off regular lower body clothing?: None 6 Click Score: 23   End of Session Equipment Utilized During Treatment: Gait belt;Rolling walker  Activity Tolerance: Patient tolerated treatment well Patient left: in chair;with call bell/phone within reach;with chair alarm  set;with family/visitor present;Other (comment)(rolled towel under R ankle, polar care in place)  OT Visit Diagnosis: Other abnormalities of gait and mobility (R26.89);Pain Pain - Right/Left: Right Pain - part of body: Knee                Time: [id number] OT Time Calculation (min): 45 min Charges:  OT General Charges $OT Visit: 1 Visit OT Evaluation $OT Eval Low Complexity: 1 Low OT Treatments $Self Care/Home Management : 38-52 mins  Richrd PrimeJamie Stiller, MPH, MS, OTR/L ascom [phone] 01/25/18, 9:56 AM

## 2018-01-25 NOTE — Progress Notes (Signed)
ORTHOPAEDICS PROGRESS NOTE  PATIENT NAME: Lori Kidd DOB: 06/07/1950  MRN: [id number]  POD # 1: Right total knee arthroplasty  Subjective: The patient rested well last night.  Pain has been under excellent control.  She denies any nausea or vomiting.  Her appetite has been good.  Objective: Vital signs in last 24 hours: Temp:  [96.6 F (35.9 C)-98.6 F (37 C)] 98 F (36.7 C) (12/31 0025) Pulse Rate:  [70-95] 91 (12/31 0025) Resp:  [7-19] 19 (12/31 0025) BP: (95-151)/(45-91) 105/57 (12/31 0025) SpO2:  [96 %-100 %] 100 % (12/31 0025) Weight:  [79.2 kg] 79.2 kg (12/30 1644)  Intake/Output from previous day: 12/30 0701 - 12/31 0700 In: 3636.9 [P.O.:480; I.V.:2756.9; IV Piggyback:400] Out: 1460 [Urine:1350; Drains:60; Blood:50]  No results for input(s): WBC, HGB, HCT, PLT, K, CL, CO2, BUN, CREATININE, GLUCOSE, CALCIUM, LABPT, INR in the last 72 hours.  EXAM General: Well-developed well-nourished female seen in no apparent discomfort. Lungs: clear to auscultation Cardiac: normal rate and regular rhythm Abdomen: Soft, nontender, nondistended.  Bowel sounds are present. Right lower extremity: Dressing is dry and intact.  Polar Care and Hemovac are in place and functioning.  Bone foam is in place with the knee fully extended.  The patient is able to perform an independent straight leg raise.  Homans test is negative. Neurologic: Awake, alert, and oriented.  Sensory and motor function are intact.  Assessment: Right total knee arthroplasty  Secondary diagnoses: Hypothyroidism Hypercholesterolemia Hypertension Asthma  Plan: Today's goals were reviewed with the patient. Begin physical therapy and Occupational Therapy as per total knee arthroplasty rehab protocol. Plan is to go Home after hospital stay. DVT Prophylaxis - Lovenox, Foot Pumps and TED hose  Regena Delucchi P. Angie FavaHooten, Jr. M.D.

## 2018-01-25 NOTE — Evaluation (Signed)
Physical Therapy Evaluation Patient Details Name: Lori HighmanJennie Jenkins Kidd MRN: [id number] DOB: 02/11/1950 Today's Date: 01/25/2018   History of Present Illness  67yo female s/p R TKA 01/14/18.  Clinical Impression  Pt did very well with POD1 PT exam and was able to achieve and surpass expected milestones at this point.  She displayed good quad control and tolerance to ~15 minutes of strength and ROM exercises apart from the exam.  She was also able to ambulate ~100 ft with walker and with cuing/gait training was able to display consistent, safe cadence without excessive reliance on the RW.  Pt did not require physical assist with mobility tasks.  Overall pt did well and showed excellent POD1 function, ROM, strength, she will be able to safely return home once medically ready for d/c.      Follow Up Recommendations Home health PT    Equipment Recommendations  Rolling walker with 5" wheels    Recommendations for Other Services       Precautions / Restrictions Precautions Precautions: Fall Restrictions Weight Bearing Restrictions: Yes RLE Weight Bearing: Weight bearing as tolerated      Mobility  Bed Mobility Overal bed mobility: Modified Independent             General bed mobility comments: Not tested, in recliner  Transfers Overall transfer level: Needs assistance Equipment used: Rolling walker (2 wheeled) Transfers: Sit to/from Stand Sit to Stand: Supervision         General transfer comment: minimal reminders for set up, sequencing, placement  Ambulation/Gait Ambulation/Gait assistance: Supervision Gait Distance (Feet): 100 Feet Assistive device: Rolling walker (2 wheeled)       General Gait Details: Pt with very little initial hesitancy and quickly got into a consistent and appropriate cadence with good WBing tolerance.    Stairs            Wheelchair Mobility    Modified Rankin (Stroke Patients Only)       Balance Overall balance  assessment: Modified Independent                                           Pertinent Vitals/Pain Pain Assessment: 0-10 Pain Score: 6  Pain Location: 2/10 at rest, increased significantly with ROM acts, 6/10 during ambulation post exercise Pain Descriptors / Indicators: Aching Pain Intervention(s): Limited activity within patient's tolerance;Monitored during session;Repositioned;Ice applied;Patient requesting pain meds-RN notified    Home Living Family/patient expects to be discharged to:: Private residence Living Arrangements: Spouse/significant other Available Help at Discharge: Family;Available 24 hours/day Type of Home: House Home Access: Stairs to enter Entrance Stairs-Rails: Right;Left(wide) Entrance Stairs-Number of Steps: 3 + R/L rails in the front; 2 steps w/ no rails from garage Home Layout: Able to live on main level with bedroom/bathroom;Two level Home Equipment: Shower seat - built in;Bedside commode Additional Comments: has a wide 2WW that is her father in law's     Prior Function Level of Independence: Independent         Comments: pt independent with mobility, driving, working as a Architectchurch secretary, 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     Hand Dominance        Extremity/Trunk Assessment   Upper Extremity Assessment Upper Extremity Assessment: Overall WFL for tasks assessed    Lower Extremity Assessment Lower Extremity Assessment: Overall WFL for tasks assessed(expected weakness, but good POD1 RLE strength)  Cervical / Trunk Assessment Cervical / Trunk Assessment: Normal  Communication   Communication: No difficulties  Cognition Arousal/Alertness: Awake/alert Behavior During Therapy: WFL for tasks assessed/performed Overall Cognitive Status: Within Functional Limits for tasks assessed                                        General Comments      Exercises Total Joint Exercises Ankle Circles/Pumps:  AROM;10 reps Quad Sets: Strengthening;10 reps Short Arc Quad: Strengthening;10 reps;AROM Heel Slides: Strengthening;10 reps Hip ABduction/ADduction: Strengthening;10 reps Straight Leg Raises: AROM;10 reps;Strengthening Knee Flexion: PROM;5 reps Goniometric ROM: 0-95 (91 AROM) Other Exercises Other Exercises: pt/spouse educated in compression stocking mgt, polar care mgt, functional mobility training with RW with kitchen/bathroom environments, pet considerations, falls prevention, and AE/DME for ADL to maximize safety/independence - pt/spouse verbalized understanding of all edu/training provided   Assessment/Plan    PT Assessment Patient needs continued PT services  PT Problem List Decreased strength;Decreased activity tolerance;Decreased mobility;Decreased balance;Decreased coordination;Decreased range of motion;Decreased knowledge of use of DME;Decreased safety awareness;Pain       PT Treatment Interventions DME instruction;Gait training;Stair training;Functional mobility training;Therapeutic activities;Therapeutic exercise;Balance training;Neuromuscular re-education;Patient/family education    PT Goals (Current goals can be found in the Care Plan section)  Acute Rehab PT Goals Patient Stated Goal: to return to PLOF PT Goal Formulation: With patient Time For Goal Achievement: 02/08/18 Potential to Achieve Goals: Good    Frequency BID   Barriers to discharge        Co-evaluation               AM-PAC PT "6 Clicks" Mobility  Outcome Measure Help needed turning from your back to your side while in a flat bed without using bedrails?: None Help needed moving from lying on your back to sitting on the side of a flat bed without using bedrails?: None Help needed moving to and from a bed to a chair (including a wheelchair)?: None Help needed standing up from a chair using your arms (e.g., wheelchair or bedside chair)?: None Help needed to walk in hospital room?: None Help  needed climbing 3-5 steps with a railing? : A Little 6 Click Score: 23    End of Session Equipment Utilized During Treatment: Gait belt Activity Tolerance: Patient tolerated treatment well Patient left: with chair alarm set;with call bell/phone within reach;with family/visitor present   PT Visit Diagnosis: Muscle weakness (generalized) (M62.81);Difficulty in walking, not elsewhere classified (R26.2)    Time: 7829-56210925-1001 PT Time Calculation (min) (ACUTE ONLY): 36 min   Charges:   PT Evaluation $PT Eval Low Complexity: 1 Low PT Treatments $Gait Training: 8-22 mins $Therapeutic Exercise: 8-22 mins        Malachi ProGalen R Adrianne Shackleton, DPT 01/25/2018, 11:17 AM

## 2018-01-25 NOTE — Care Management Note (Signed)
Case Management Note  Patient Details  Name: Roniya Tetro MRN: [id number] Date of Birth: 01/04/1951  Subjective/Objective:                  RNCM met with patient and spouse to discuss discharge Patient has HX of going to Outpatient PT at [facility], however she plans to DC to home followed by [facility] for PT She has a bedside commode but will need a front wheeled walker She uses [facility] [phone] for medications, lovenox cost is 64$, encouraged to check with Pharmacy about hours open with the Holiday tomorrow PCP is Dr. Netty Starring with [facility]  Action/Plan:     [facility] agency list provide per CMS.gov, referral made to [facility] with [facility] Referral made to [facility] with [facility] for rolling walker    Expected Discharge Date:                  Expected Discharge Plan:     In-House Referral:     Discharge planning Services  CM Consult  Post Acute Care Choice:  Home Health, Durable Medical Equipment Choice offered to:  Patient, Spouse  DME Arranged:    DME Agency:  [place]:  PT [place]:  [facility] ([facility]  Status of Service:  In process, will continue to follow  If discussed at Long Length of Stay Meetings, dates discussed:    Additional Comments:  Su Hilt, RN 01/25/2018, 3:51 PM

## 2018-01-26 LAB — CBC
HEMATOCRIT: 30.2 % — AB (ref 36.0–46.0)
HEMOGLOBIN: 10 g/dL — AB (ref 12.0–15.0)
MCH: 30 pg (ref 26.0–34.0)
MCHC: 33.1 g/dL (ref 30.0–36.0)
MCV: 90.7 fL (ref 80.0–100.0)
Platelets: 206 10*3/uL (ref 150–400)
RBC: 3.33 MIL/uL — ABNORMAL LOW (ref 3.87–5.11)
RDW: 12.6 % (ref 11.5–15.5)
WBC: 10.8 10*3/uL — ABNORMAL HIGH (ref 4.0–10.5)
nRBC: 0 % (ref 0.0–0.2)

## 2018-01-26 NOTE — Progress Notes (Signed)
   Subjective: 2 Days Post-Op Procedure(s) (LRB): COMPUTER ASSISTED TOTAL KNEE ARTHROPLASTY (Right) Patient reports pain as mild.   Patient is well, and has had no acute complaints or problems Denies any CP, SOB, ABD pain. We will continue therapy today.  Plan is to go Home after hospital stay.  Objective: Vital signs in last 24 hours: Temp:  [97.7 F (36.5 C)-98.2 F (36.8 C)] 97.7 F (36.5 C) (01/01 0010) Pulse Rate:  [84-88] 84 (01/01 0010) Resp:  [18] 18 (01/01 0010) BP: (111-127)/(59-70) 127/67 (01/01 0010) SpO2:  [96 %-99 %] 96 % (01/01 0010)  Intake/Output from previous day: 12/31 0701 - 01/01 0700 In: 1965.4 [P.O.:1440; I.V.:325.4; IV Piggyback:200] Out: 50 [Drains:50] Intake/Output this shift: No intake/output data recorded.  Recent Labs    01/26/18 0328  HGB 10.0*   Recent Labs    01/26/18 0328  WBC 10.8*  RBC 3.33*  HCT 30.2*  PLT 206   No results for input(s): NA, K, CL, CO2, BUN, CREATININE, GLUCOSE, CALCIUM in the last 72 hours. No results for input(s): LABPT, INR in the last 72 hours.  EXAM General - Patient is Alert, Appropriate and Oriented Extremity - Neurovascular intact Sensation intact distally Intact pulses distally Dorsiflexion/Plantar flexion intact No cellulitis present Compartment soft Dressing - dressing C/D/I and no drainage Motor Function - intact, moving foot and toes well on exam.   Past Medical History:  Diagnosis Date  . Arthritis   . Asthma   . High blood pressure   . High cholesterol   . Hypothyroidism   . Pneumonia     Assessment/Plan:   2 Days Post-Op Procedure(s) (LRB): COMPUTER ASSISTED TOTAL KNEE ARTHROPLASTY (Right) Active Problems:   Total knee replacement status  Estimated body mass index is 30.91 kg/m as calculated from the following:   Height as of this encounter: 5\' 3"  (1.6 m).   Weight as of this encounter: 79.2 kg. Advance diet Up with therapy  Patient doing well.  Pain control.  Vital signs  stable. Discharge home with home health PT today. Change dressing just prior to discharge.  DVT Prophylaxis - Lovenox, Foot Pumps and TED hose Weight-Bearing as tolerated to right leg   T. Cranston Neighbor, PA-C [facility] 01/26/2018, 7:51 AM

## 2018-01-26 NOTE — Plan of Care (Signed)

## 2018-01-26 NOTE — Progress Notes (Signed)
Pt. Discharged to home via family vehicle. Discharge instructions and medication regimen reviewed at bedside with patient. Pt. verbalizes understanding of instructions and medication regimen. Prescriptions sent to pharmacy. Patient assessment unchanged from this morning. IV discontinued per policy. Dressing changed.

## 2018-01-26 NOTE — Progress Notes (Signed)
Physical Therapy Treatment Patient Details Name: Lori Kidd MRN: [id number] DOB: [birth year] Today's Date: 01/26/2018    History of Present Illness 67yo female s/p R TKA 01/14/18.    PT Comments    Pt agreeable to PT; reports R knee pain 2/10 at rest (increases with work and returns to 2 post work). Pt R knee range progressing nicely 3-96 degrees. Educated on importance of achieving and maintaining R knee extension. Educated in all activity expectations including frequency, duration and repetitions as well as positioning, rest, use of ice and knee positioning. Answered all pt/spouse questions to their satisfaction. New rolling walker adjusted for proper fit. Pt demonstrating bed mobility and STS transfers well. Becomes light headed and unable to walk at this time. Pt notes this happened yesterday post taking medication. Pt will plan to decrease to a lighter pain medication to avoid lightheaded feeling. Pt prepared to discharge home with Home Health PT.   Follow Up Recommendations  Home health PT     Equipment Recommendations  Rolling walker with 5" wheels;Other (comment)(received)    Recommendations for Other Services       Precautions / Restrictions Precautions Precautions: Fall Restrictions Weight Bearing Restrictions: Yes RLE Weight Bearing: Weight bearing as tolerated    Mobility  Bed Mobility Overal bed mobility: Modified Independent             General bed mobility comments: Mild increased time  Transfers Overall transfer level: Needs assistance Equipment used: Rolling walker (2 wheeled) Transfers: Sit to/from Stand Sit to Stand: Min guard(due to light headedness)         General transfer comment: New rw adjusted to appropriate height  Ambulation/Gait             General Gait Details: Unable due to lightheadedness with stand; previously walked very well for 250 feet   Stairs             Wheelchair Mobility    Modified Rankin  (Stroke Patients Only)       Balance                                            Cognition Arousal/Alertness: Awake/alert Behavior During Therapy: WFL for tasks assessed/performed Overall Cognitive Status: Within Functional Limits for tasks assessed                                 General Comments: Becomes light headed requiring need to lie back down; feels medicine related      Exercises Total Joint Exercises Knee Flexion: AROM;Right;10 reps;Seated(3 positions each rep with hold) Goniometric ROM: 3-96 Other Exercises Other Exercises: education on activity level and frequency, position of R knee, stretching, and sleeping position. Answered questions to their satisfaction    General Comments        Pertinent Vitals/Pain Pain Assessment: 0-10 Pain Score: 2 (at rest increased with stretch) Pain Location: R knee Pain Descriptors / Indicators: Constant;Aching Pain Intervention(s): Monitored during session;Premedicated before session;Ice applied    Home Living                      Prior Function            PT Goals (current goals can now be found in the care plan section) Progress towards PT goals: Progressing toward  goals    Frequency    BID      PT Plan Current plan remains appropriate    Co-evaluation              AM-PAC PT "6 Clicks" Mobility   Outcome Measure  Help needed turning from your back to your side while in a flat bed without using bedrails?: None Help needed moving from lying on your back to sitting on the side of a flat bed without using bedrails?: None Help needed moving to and from a bed to a chair (including a wheelchair)?: None Help needed standing up from a chair using your arms (e.g., wheelchair or bedside chair)?: None Help needed to walk in hospital room?: A Little Help needed climbing 3-5 steps with a railing? : A Little 6 Click Score: 22    End of Session   Activity Tolerance:  Patient tolerated treatment well;Other (comment)(limited by lightheadedness) Patient left: in bed;with call bell/phone within reach;with family/visitor present;Other (comment)(polar care in place)   PT Visit Diagnosis: Muscle weakness (generalized) (M62.81);Difficulty in walking, not elsewhere classified (R26.2)     Time: [id number] PT Time Calculation (min) (ACUTE ONLY): 23 min  Charges:  $Therapeutic Exercise: 8-22 mins $Therapeutic Activity: 8-22 mins                      Scot DockHeidi E Ayo Guarino, PTA 01/26/2018, 11:37 AM

## 2018-01-31 ENCOUNTER — Telehealth: Payer: Self-pay

## 2018-01-31 NOTE — Telephone Encounter (Signed)
EMMI Follow-up: Noted on the report that the patient wasn't sure who to contact in case there was a change in her condition and had some unfilled Rx's.  Left a message on her voicemail with my contact information so the patient could call me at her convenience.

## 2018-07-09 IMAGING — US US PELVIS COMPLETE TRANSABD/TRANSVAG
1 series · 14 of 25 positions shown · non-contrast
Comparison: None

CLINICAL DATA: Right adnexal tenderness. Status post hysterectomy
and left oophorectomy.

EXAM:
TRANSABDOMINAL AND TRANSVAGINAL ULTRASOUND OF PELVIS
TECHNIQUE: Both transabdominal and transvaginal ultrasound examinations of the
pelvis were performed. Transabdominal technique was performed for
global imaging of the pelvis including uterus, ovaries, adnexal
regions, and pelvic cul-de-sac. It was necessary to proceed with
endovaginal exam following the transabdominal exam to visualize the
right ovary.

[Series 1: us pelvis complete transabd/transvag · 0.31mm/px · 14 of 35 slices shown]
[im 1/35]
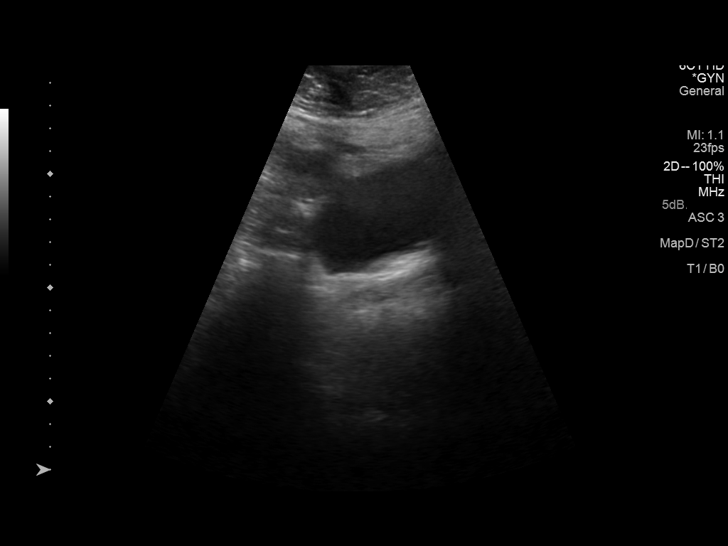
[im 3/35]
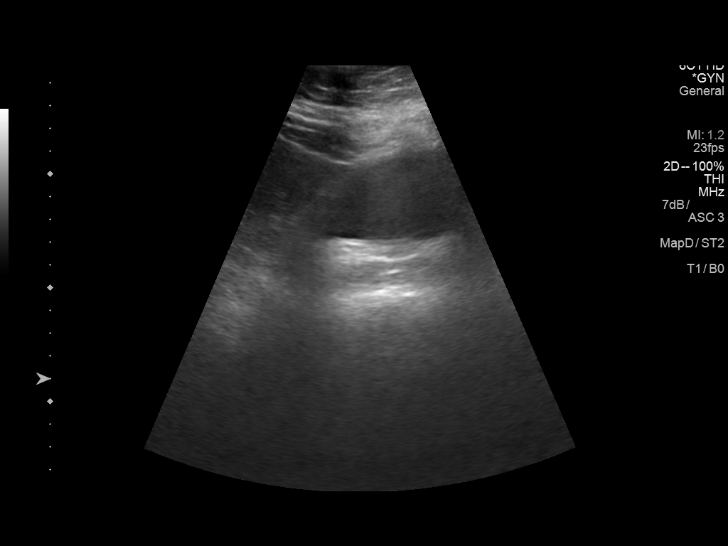
[im 6/35]
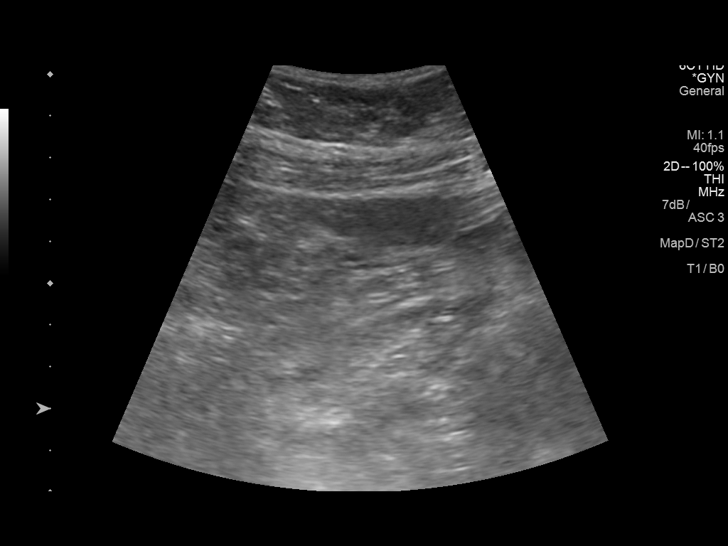
[im 9/35]
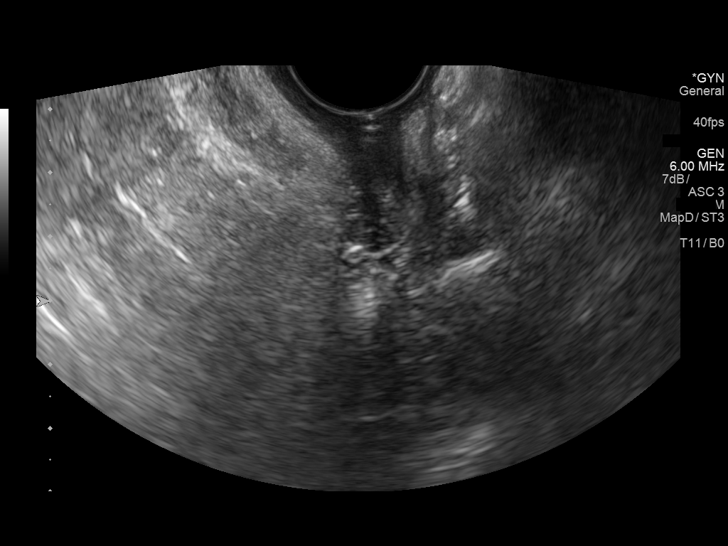
[im 12/35]
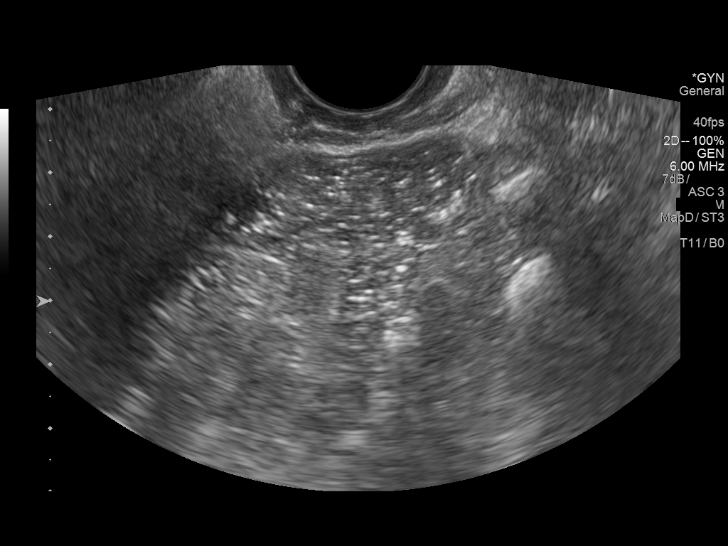
[im 13/35]
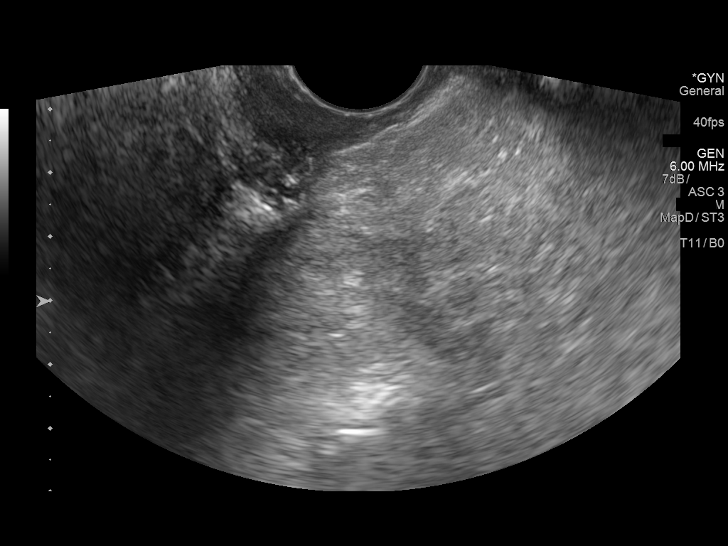
[im 16/35]
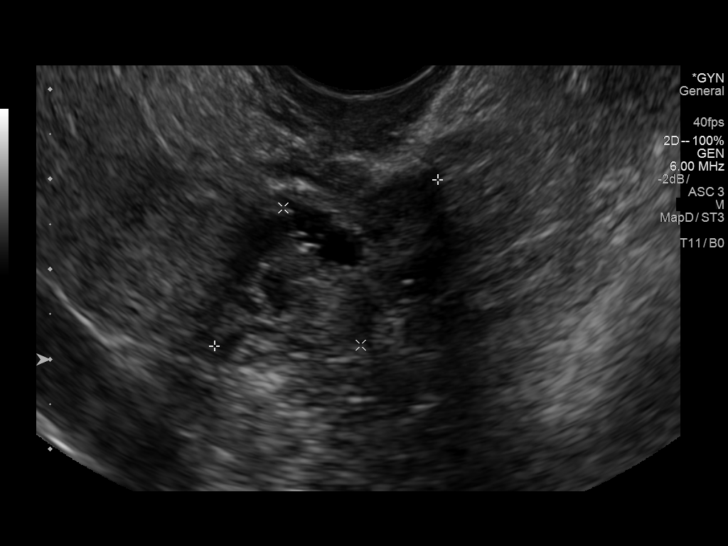
[im 19/35]
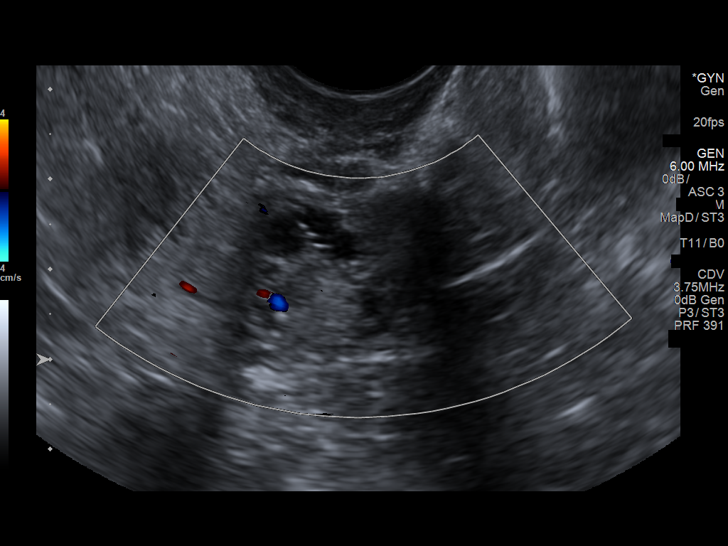
[im 22/35]
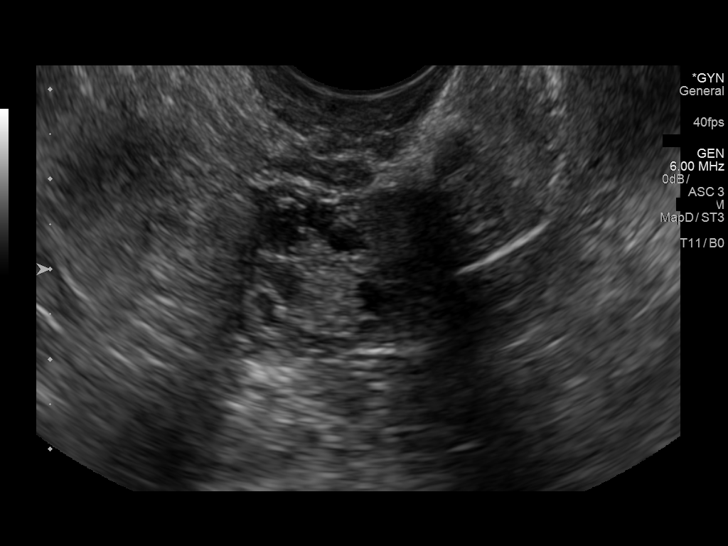
[im 23/35]
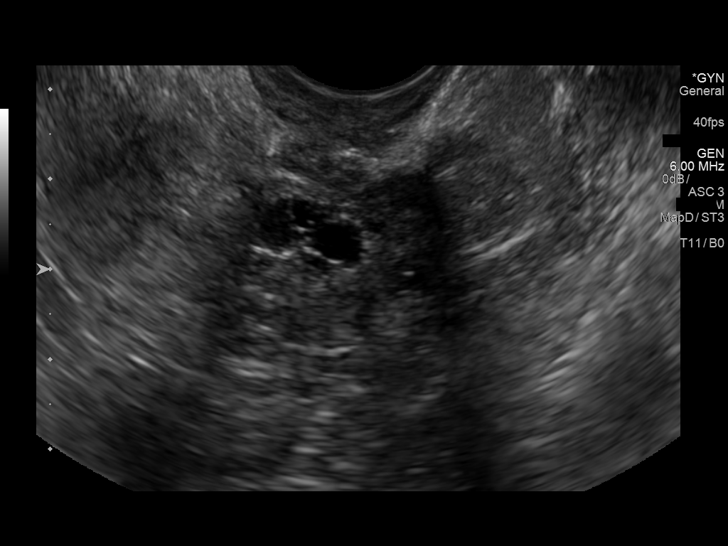
[im 26/35]
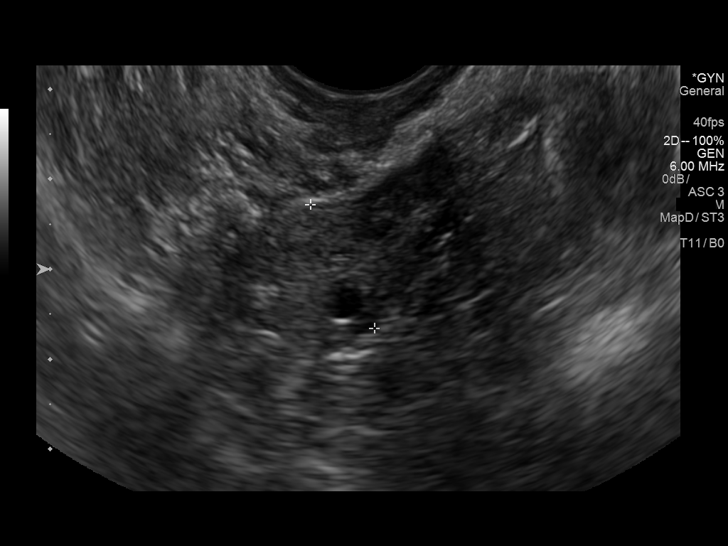
[im 29/35]
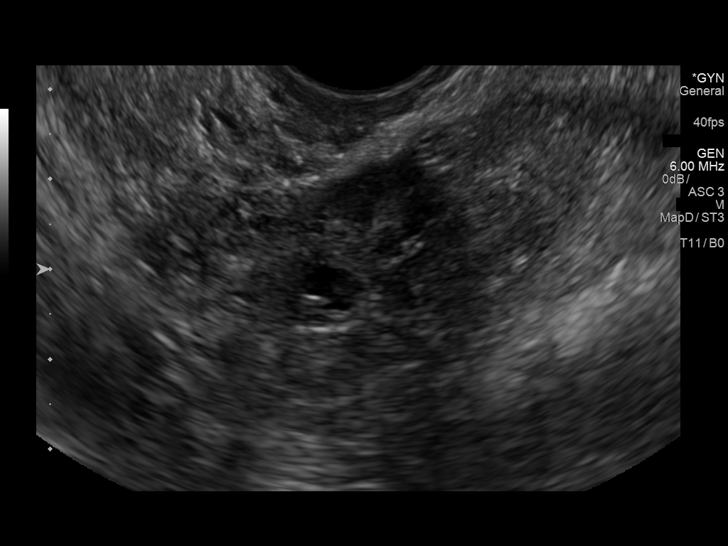
[im 32/35]
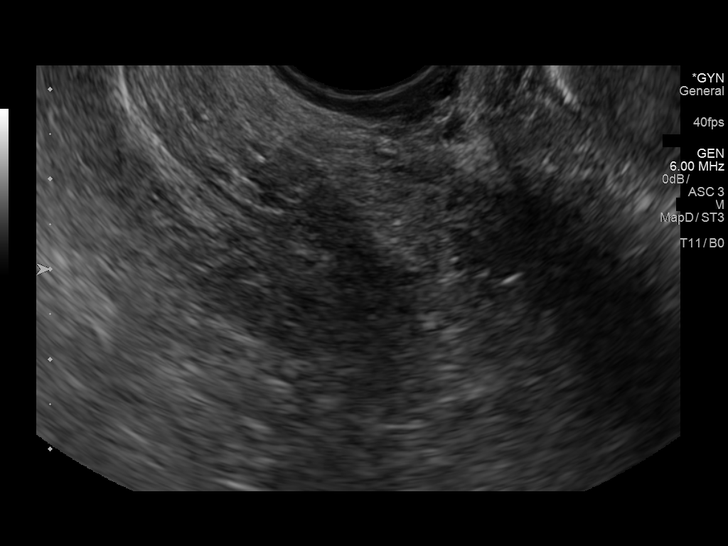
[im 35/35]
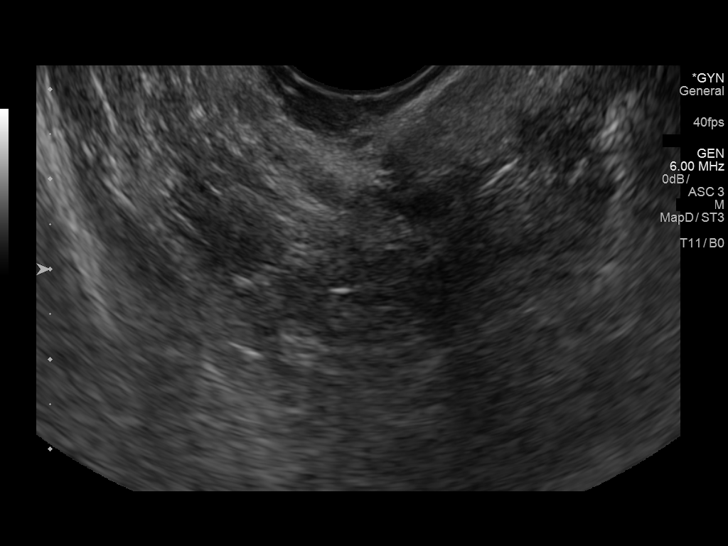

[14 of 25 positions shown; findings below may reference images not displayed]

FINDINGS: Uterus

Surgically absent.

Right ovary

Measurements: 3.1 x 1.8 x 1.8 cm. Normal appearance/no adnexal mass.

Left ovary

Surgically absent.

Other findings

No abnormal free fluid.
IMPRESSION: The right ovary is unremarkable. No cause for adnexal tenderness
identified.

## 2018-07-25 ENCOUNTER — Other Ambulatory Visit: Payer: Self-pay | Admitting: Family Medicine

## 2018-07-25 DIAGNOSIS — Z1231 Encounter for screening mammogram for malignant neoplasm of breast: Secondary | ICD-10-CM

## 2018-08-04 ENCOUNTER — Ambulatory Visit
Admission: RE | Admit: 2018-08-04 | Discharge: 2018-08-04 | Disposition: A | Discharge status: Home / Self Care | Payer: Medicare Other | Source: Ambulatory Visit | Attending: Family Medicine | Admitting: Family Medicine

## 2018-08-04 ENCOUNTER — Other Ambulatory Visit: Payer: Self-pay

## 2018-08-04 DIAGNOSIS — Z1231 Encounter for screening mammogram for malignant neoplasm of breast: Secondary | ICD-10-CM | POA: Insufficient documentation

## 2019-02-22 IMAGING — MG MM DIGITAL DIAGNOSTIC UNILAT*L* W/ TOMO W/ CAD
4 series · 4 of 12 positions shown · non-contrast
Comparison: Previous exam(s).

CLINICAL DATA: Patient recalled from screening for possible left
breast mass.

EXAM:
DIGITAL DIAGNOSTIC UNILATERAL LEFT MAMMOGRAM WITH CAD AND TOMO

[L MLO synth-2D]
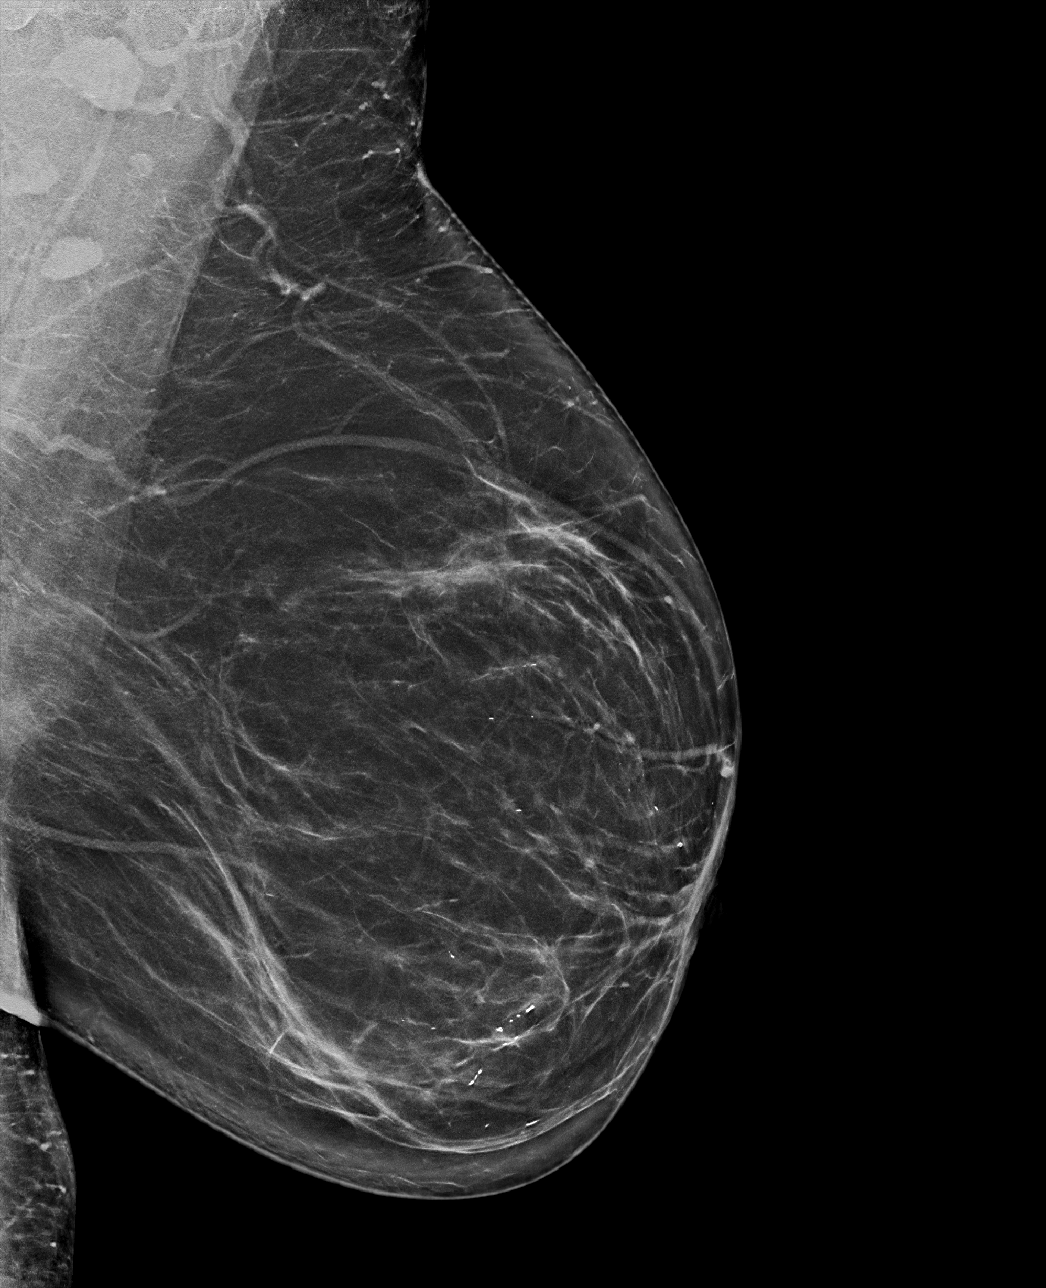

[L CC synth-2D]
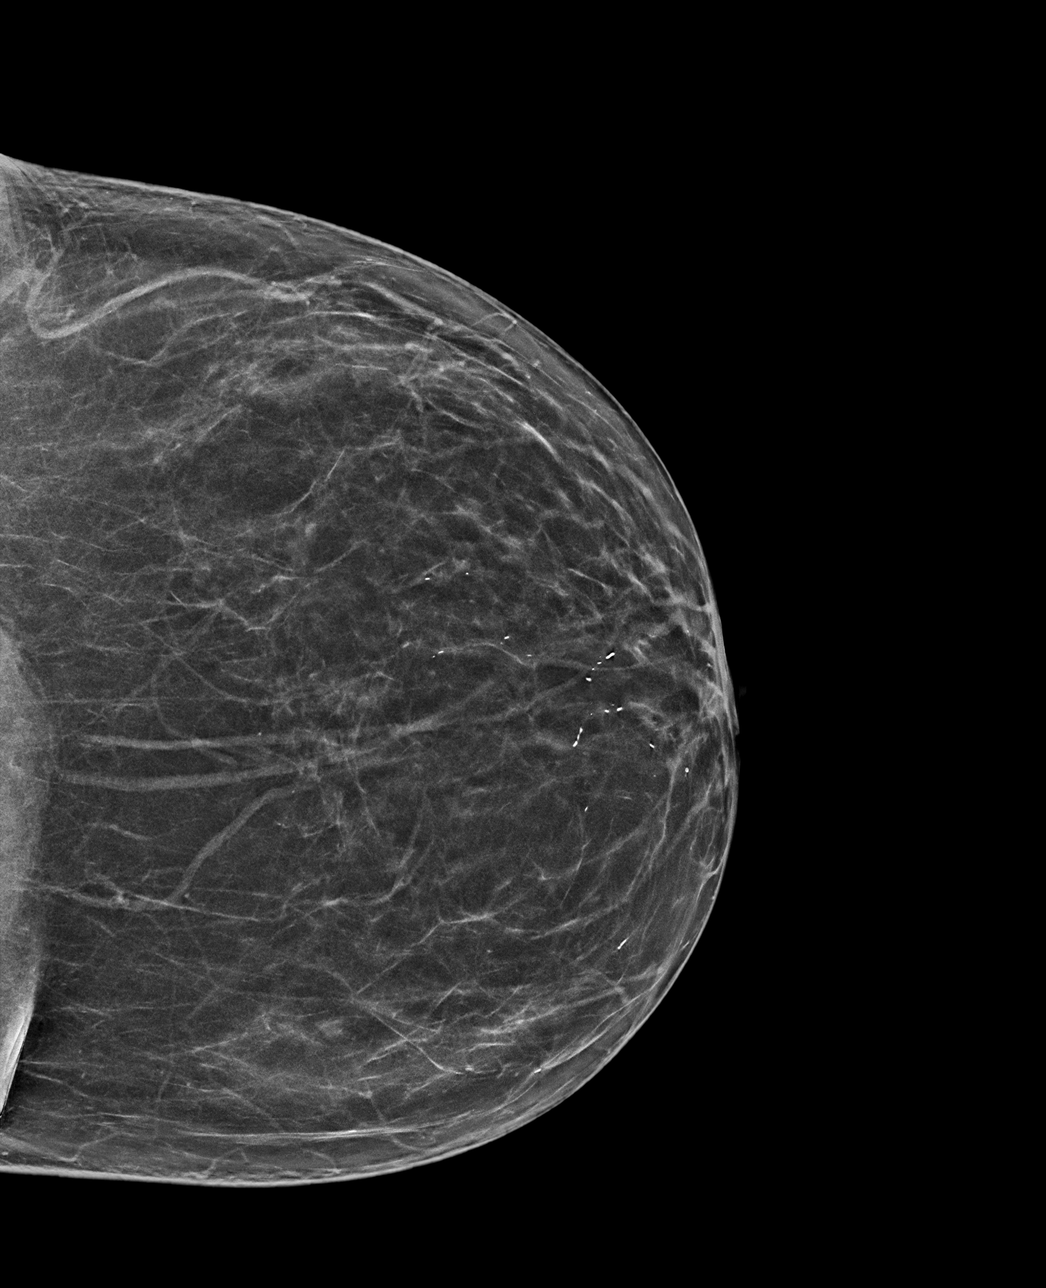

[L MLO tomo · tomo slice 41/82.0]
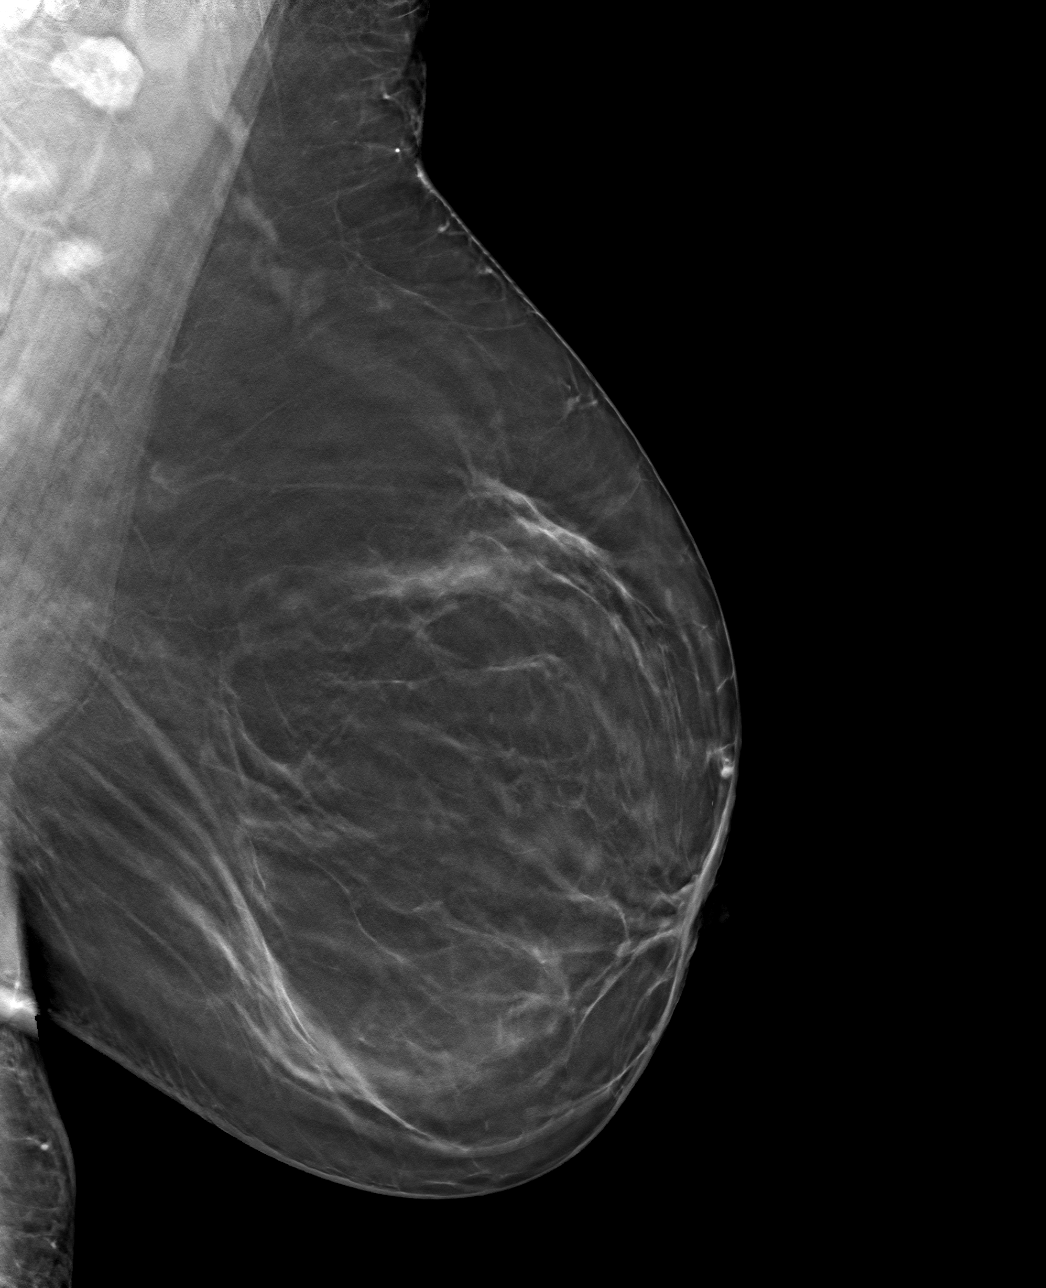

[L CC tomo · tomo slice 35/70.0]
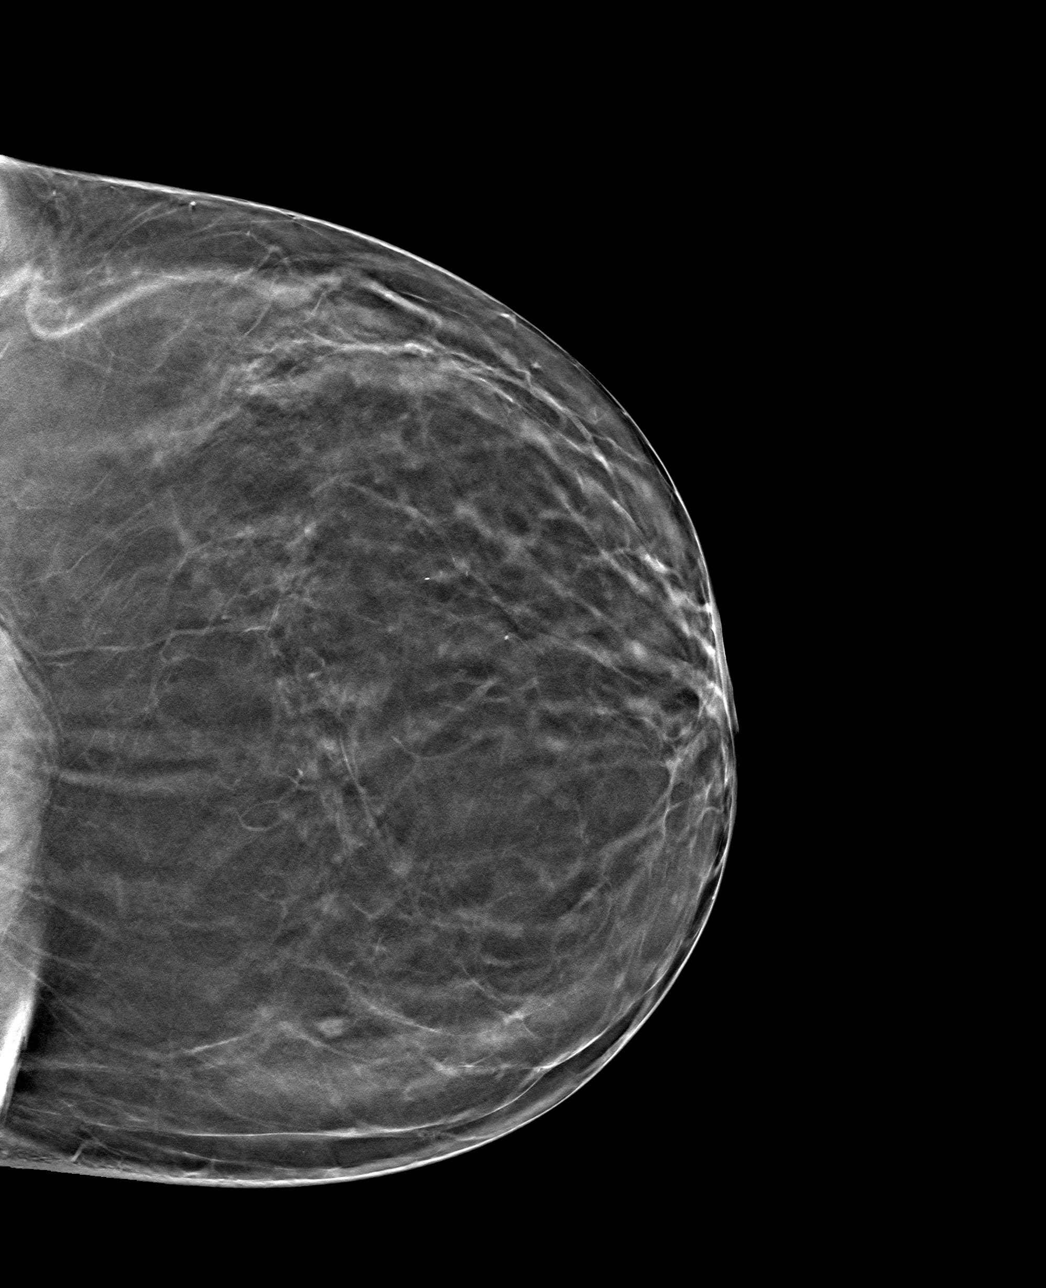

[4 of 12 positions shown; findings below may reference images not displayed]

ACR Breast Density Category b: There are scattered areas of
fibroglandular density.
FINDINGS: Questioned mass within the lateral left breast resolved with
additional imaging compatible with overlapping fibroglandular
tissue.

Mammographic images were processed with CAD.
IMPRESSION: No mammographic evidence for malignancy.

RECOMMENDATION:
Screening mammogram in one year.(Code:[id number])

I have discussed the findings and recommendations with the patient.
Results were also provided in writing at the conclusion of the
visit. If applicable, a reminder letter will be sent to the patient
regarding the next appointment.

BI-RADS CATEGORY  1: Negative.

## 2019-04-05 DIAGNOSIS — Z0000 Encounter for general adult medical examination without abnormal findings: Secondary | ICD-10-CM | POA: Insufficient documentation

## 2019-07-06 ENCOUNTER — Other Ambulatory Visit: Payer: Self-pay | Admitting: Family Medicine

## 2019-07-06 DIAGNOSIS — Z1231 Encounter for screening mammogram for malignant neoplasm of breast: Secondary | ICD-10-CM

## 2019-08-08 ENCOUNTER — Other Ambulatory Visit: Payer: Self-pay | Admitting: Family Medicine

## 2019-08-08 DIAGNOSIS — Z1231 Encounter for screening mammogram for malignant neoplasm of breast: Secondary | ICD-10-CM

## 2019-08-09 ENCOUNTER — Other Ambulatory Visit: Payer: Self-pay

## 2019-08-09 ENCOUNTER — Ambulatory Visit
Admission: RE | Admit: 2019-08-09 | Discharge: 2019-08-09 | Disposition: A | Discharge status: Home / Self Care | Payer: Medicare PPO | Source: Ambulatory Visit | Attending: Family Medicine | Admitting: Family Medicine

## 2019-08-09 DIAGNOSIS — Z1231 Encounter for screening mammogram for malignant neoplasm of breast: Secondary | ICD-10-CM | POA: Diagnosis not present

## 2019-10-10 IMAGING — DX DG KNEE 1-2V PORT*R*
2 series · 2 of 2 positions shown · non-contrast
Comparison: None.

CLINICAL DATA: Right knee replacement.

EXAM:
PORTABLE RIGHT KNEE - 1-2 VIEW

[knee ap]
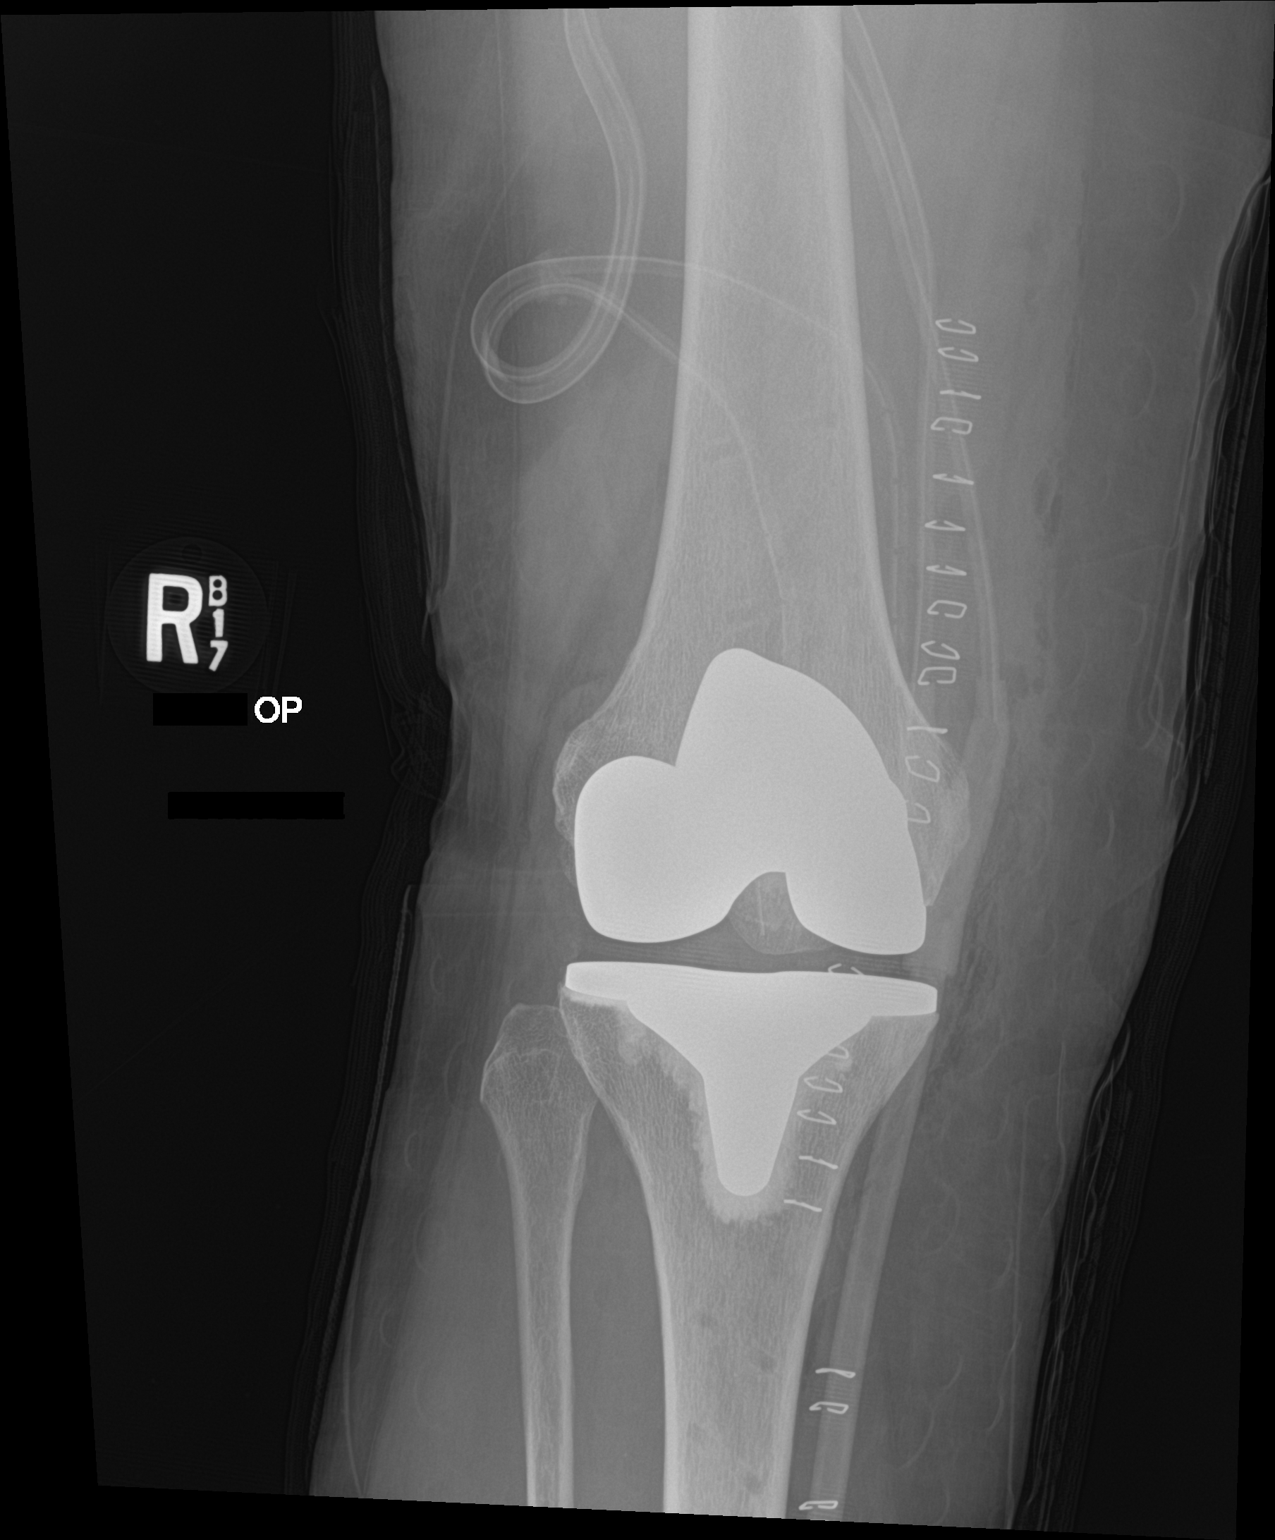

[knee lat]
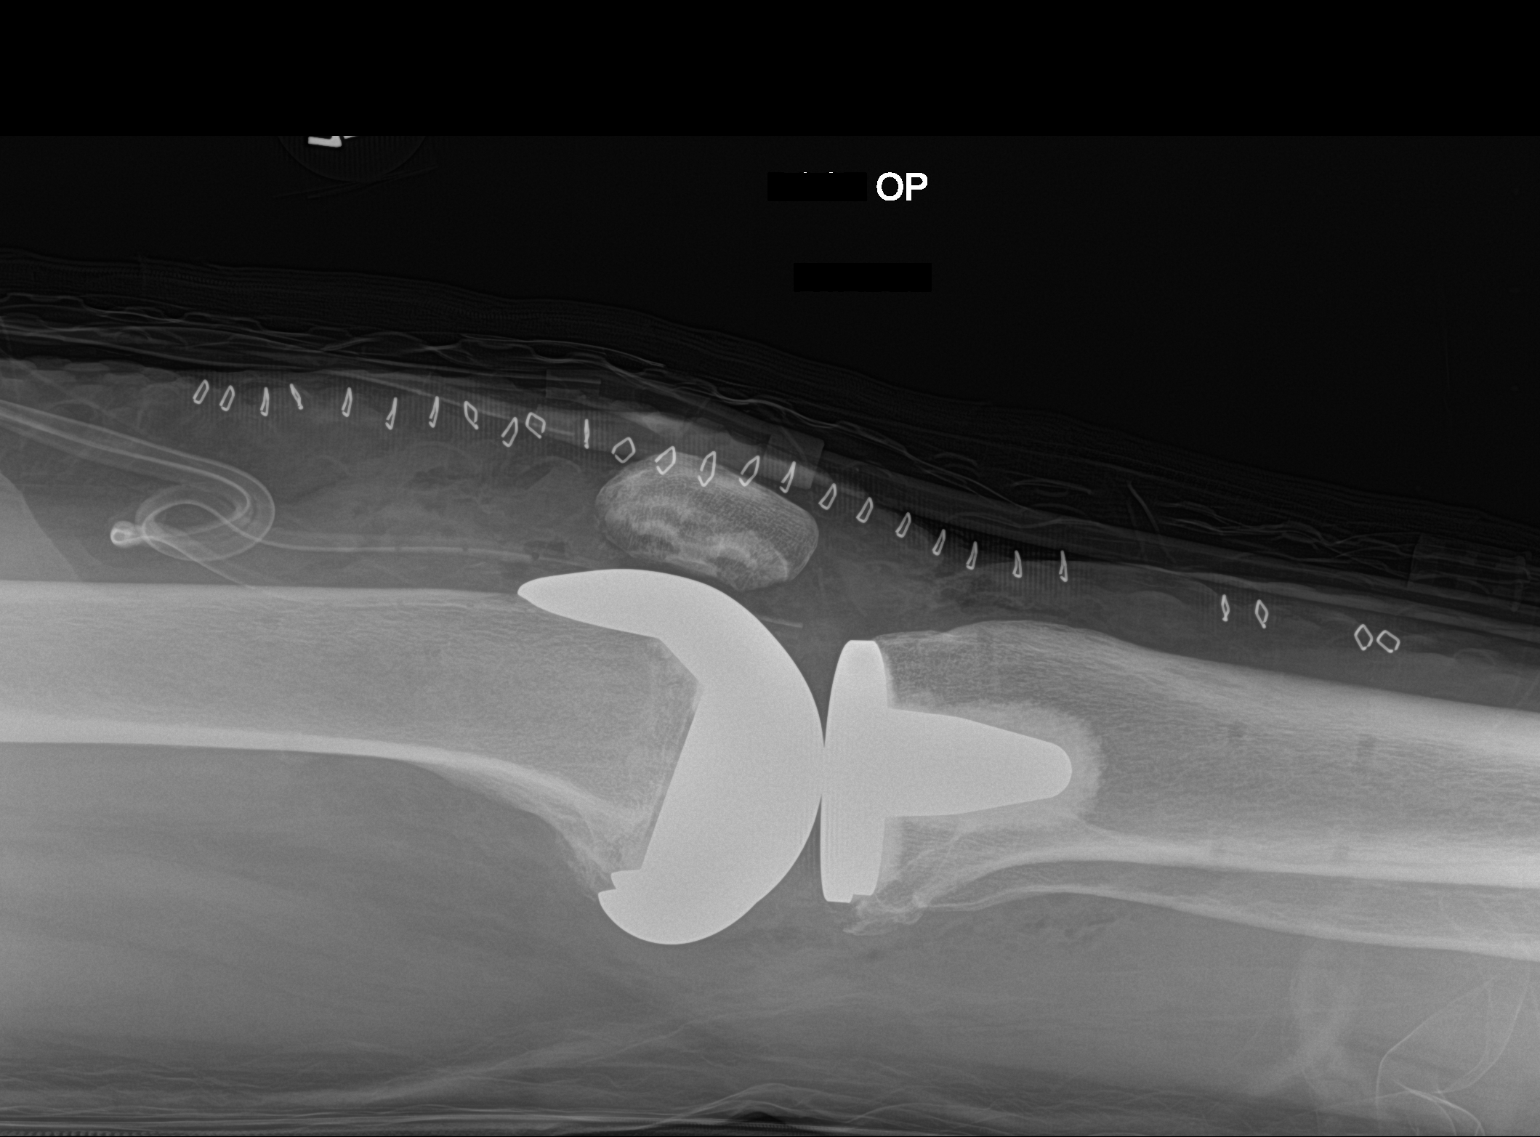

[2 of 2 positions shown; findings below may reference images not displayed]

FINDINGS: The right knee demonstrates a total knee arthroplasty without
evidence of hardware failure or complication. There is expected
intra-articular air. There is no fracture or dislocation. The
alignment is anatomic. Surgical drains are present. Post-surgical
changes noted in the surrounding soft tissues.
IMPRESSION: Interval right total knee arthroplasty without acute postoperative
complication.

## 2020-05-15 DIAGNOSIS — I739 Peripheral vascular disease, unspecified: Secondary | ICD-10-CM | POA: Insufficient documentation

## 2020-06-13 ENCOUNTER — Other Ambulatory Visit: Payer: Self-pay | Admitting: Otolaryngology

## 2020-06-13 DIAGNOSIS — R42 Dizziness and giddiness: Secondary | ICD-10-CM

## 2020-06-20 ENCOUNTER — Ambulatory Visit
Admission: RE | Admit: 2020-06-20 | Discharge: 2020-06-20 | Disposition: A | Discharge status: Home / Self Care | Payer: Medicare PPO | Source: Ambulatory Visit | Attending: Otolaryngology | Admitting: Otolaryngology

## 2020-06-20 ENCOUNTER — Other Ambulatory Visit: Payer: Self-pay

## 2020-06-20 DIAGNOSIS — R42 Dizziness and giddiness: Secondary | ICD-10-CM | POA: Diagnosis present

## 2020-07-12 ENCOUNTER — Ambulatory Visit (INDEPENDENT_AMBULATORY_CARE_PROVIDER_SITE_OTHER): Payer: Medicare PPO | Admitting: Vascular Surgery

## 2020-07-12 ENCOUNTER — Other Ambulatory Visit: Payer: Self-pay

## 2020-07-12 ENCOUNTER — Encounter (INDEPENDENT_AMBULATORY_CARE_PROVIDER_SITE_OTHER): Payer: Self-pay | Admitting: Vascular Surgery

## 2020-07-12 VITALS — BP 128/78 | HR 88 | Resp 16 | Ht 63.0 in | Wt 157.2 lb

## 2020-07-12 DIAGNOSIS — E782 Mixed hyperlipidemia: Secondary | ICD-10-CM

## 2020-07-12 DIAGNOSIS — I10 Essential (primary) hypertension: Secondary | ICD-10-CM | POA: Diagnosis not present

## 2020-07-12 DIAGNOSIS — Z8249 Family history of ischemic heart disease and other diseases of the circulatory system: Secondary | ICD-10-CM

## 2020-07-12 DIAGNOSIS — I6521 Occlusion and stenosis of right carotid artery: Secondary | ICD-10-CM | POA: Diagnosis not present

## 2020-07-12 DIAGNOSIS — I6529 Occlusion and stenosis of unspecified carotid artery: Secondary | ICD-10-CM | POA: Insufficient documentation

## 2020-07-12 DIAGNOSIS — I739 Peripheral vascular disease, unspecified: Secondary | ICD-10-CM

## 2020-07-12 NOTE — Progress Notes (Signed)
Patient ID: Lori Kidd, female   DOB: Jul 26, 1950, 70 y.o.   MRN: [id number]  Chief Complaint  Patient presents with   New Patient (Initial Visit)    Ref [place] carotid stenosis    HPI Lori Kidd is a 70 y.o. female.  I am asked to see the patient by Dr. Andee Poles for evaluation of carotid stenosis.  The patient was having some dizziness and other issues and this prompted a carotid duplex which I have reviewed.  This demonstrates mild plaque in the right carotid bifurcation consistent with 1 to 49% right ICA stenosis by the velocity criteria.  No significant disease was seen in the left carotid artery.  Both vertebral arteries were antegrade with good flow.   She has no previous history of stroke or TIA. Patient does complain of leg pain with activity and her legs are always cold.  She was told on a home health screening study that she had moderate peripheral arterial disease. The patient is also concerned about abdominal aortic aneurysm.  Her mother had an aneurysm.  She does have abdominal pain which is relatively frequent   Past Medical History:  Diagnosis Date   Arthritis    Asthma    High blood pressure    High cholesterol    Hypothyroidism    Pneumonia     Past Surgical History:  Procedure Laterality Date   APPENDECTOMY  1982   KNEE ARTHROPLASTY Right 01/24/2018   Procedure: COMPUTER ASSISTED TOTAL KNEE ARTHROPLASTY;  Surgeon: Donato Heinz, MD;  Location: [facility] ORS;  Service: Orthopedics;  Laterality: Right;   SIGMOID RESECTION / RECTOPEXY  1998   TONSILLECTOMY  1962   TUBAL LIGATION  1983   VAGINAL HYSTERECTOMY  2000     Family History  Problem Relation Age of Onset   Breast cancer Sister        early 61's   Pneumonia Mother    Pneumonia Father   Mother had a AAA   Social History   Tobacco Use   Smoking status: Former    Pack years: 0.00    Types: Cigarettes   Smokeless tobacco: Never  Vaping Use   Vaping Use: Never used   Substance Use Topics   Alcohol use: Yes    Alcohol/week: 3.0 standard drinks    Types: 3 Cans of beer per week   Drug use: Never     Allergies  Allergen Reactions   Ace Inhibitors Cough   Penicillins Rash and Other (See Comments)    Redness Has patient had a PCN reaction causing immediate rash, facial/tongue/throat swelling, SOB or lightheadedness with hypotension: Yes Has patient had a PCN reaction causing severe rash involving mucus membranes or skin necrosis: No Has patient had a PCN reaction that required hospitalization: No Has patient had a PCN reaction occurring within the last 10 years: No If all of the above answers are "NO", then may proceed with Cephalosporin use.     Current Outpatient Medications  Medication Sig Dispense Refill   Acetaminophen 500 MG capsule Take by mouth every 6 (six) hours as needed for fever.     albuterol (PROVENTIL HFA;VENTOLIN HFA) 108 (90 Base) MCG/ACT inhaler Inhale 2 puffs into the lungs every 6 (six) hours as needed for wheezing or shortness of breath.     aspirin EC 81 MG tablet Take 81 mg by mouth daily. Swallow whole.     Cholecalciferol 25 MCG (1000 UT) capsule Take by mouth.  EPINEPHrine 0.3 mg/0.3 mL IJ SOAJ injection Inject 0.3 mg into the muscle once.     fluticasone (FLONASE) 50 MCG/ACT nasal spray Place 1 spray into both nostrils daily.     Fluticasone Propionate, Inhal, 250 MCG/BLIST AEPB Inhale 1 puff into the lungs daily.     levothyroxine (SYNTHROID, LEVOTHROID) 112 MCG tablet Take 112 mcg by mouth daily before breakfast.      loratadine (CLARITIN) 10 MG tablet Take 10 mg by mouth daily as needed for allergies.     naproxen sodium (ALEVE) 220 MG tablet Take 220 mg by mouth daily as needed.     Omega-3 1000 MG CAPS Take 2,000 mg by mouth daily.      pravastatin (PRAVACHOL) 40 MG tablet Take 40 mg by mouth daily.      triamterene-hydrochlorothiazide (MAXZIDE-25) 37.5-25 MG tablet Take 1 tablet by mouth daily.     CRANBERRY  PO Take 900 mg by mouth daily.  (Patient not taking: No sig reported)     ELDERBERRY PO Take 100 mg by mouth daily. (Patient not taking: No sig reported)     enoxaparin (LOVENOX) 40 MG/0.4ML injection Inject 0.4 mLs (40 mg total) into the skin daily for 14 days. 14 Syringe 0   Melatonin 5 MG CAPS Take by mouth at bedtime as needed. (Patient not taking: No sig reported)     oxyCODONE (OXY IR/ROXICODONE) 5 MG immediate release tablet Take 1 tablet (5 mg total) by mouth every 4 (four) hours as needed for moderate pain (pain score 4-6). (Patient not taking: No sig reported) 30 tablet 0   traMADol (ULTRAM) 50 MG tablet Take 1-2 tablets (50-100 mg total) by mouth every 6 (six) hours as needed for moderate pain. (Patient not taking: No sig reported) 60 tablet 0   No current facility-administered medications for this visit.      REVIEW OF SYSTEMS (Negative unless checked)  Constitutional: [] Weight loss  [] Fever  [] Chills Cardiac: [] Chest pain   [] Chest pressure   [] Palpitations   [] Shortness of breath when laying flat   [] Shortness of breath at rest   [] Shortness of breath with exertion. Vascular:  [] Pain in legs with walking   [] Pain in legs at rest   [] Pain in legs when laying flat   [] Claudication   [] Pain in feet when walking  [] Pain in feet at rest  [] Pain in feet when laying flat   [] History of DVT   [] Phlebitis   [] Swelling in legs   [] Varicose veins   [] Non-healing ulcers Pulmonary:   [] Uses home oxygen   [] Productive cough   [] Hemoptysis   [] Wheeze  [] COPD   [] Asthma Neurologic:  [x] Dizziness  [] Blackouts   [] Seizures   [] History of stroke   [] History of TIA  [] Aphasia   [] Temporary blindness   [] Dysphagia   [] Weakness or numbness in arms   [] Weakness or numbness in legs Musculoskeletal:  [x] Arthritis   [] Joint swelling   [] Joint pain   [] Low back pain Hematologic:  [] Easy bruising  [] Easy bleeding   [] Hypercoagulable state   [] Anemic  [] Hepatitis Gastrointestinal:  [] Blood in stool    [] Vomiting blood  [] Gastroesophageal reflux/heartburn   [x] Abdominal pain Genitourinary:  [] Chronic kidney disease   [] Difficult urination  [] Frequent urination  [] Burning with urination   [] Hematuria Skin:  [] Rashes   [] Ulcers   [] Wounds Psychological:  [] History of anxiety   []  History of major depression.    Physical Exam BP 128/78 (BP Location: Right Arm)   Pulse 88  Resp 16   Ht 5\' 3"  (1.6 m)   Wt 157 lb 3.2 oz (71.3 kg)   BMI 27.85 kg/m  Gen:  WD/WN, NAD.  Appears younger than stated age Head: [place]/AT, No temporalis wasting.  Ear/Nose/Throat: Hearing grossly intact, nares w/o erythema or drainage, oropharynx w/o Erythema/Exudate Eyes: Conjunctiva clear, sclera non-icteric  Neck: trachea midline.  No JVD.  Pulmonary:  Good air movement, respirations not labored, no use of accessory muscles  Cardiac: RRR, no JVD Vascular:  Vessel Right Left  Radial Palpable Palpable                          DP 2+ 1+  PT 1+ 1+   Gastrointestinal:. No masses, surgical incisions, or scars.  No increased aortic impulse. Musculoskeletal: M/S 5/5 throughout.  Extremities without ischemic changes.  No deformity or atrophy.  Mild bilateral lower extremity edema. Neurologic: Sensation grossly intact in extremities.  Symmetrical.  Speech is fluent. Motor exam as listed above. Psychiatric: Judgment intact, Mood & affect appropriate for pt's clinical situation. Dermatologic: No rashes or ulcers noted.  No cellulitis or open wounds.    Radiology Carotid Bilateral  Result Date: 06/20/2020 CLINICAL DATA:  70 year old female with history of dizziness. EXAM: BILATERAL CAROTID DUPLEX ULTRASOUND TECHNIQUE: 79 scale imaging, color Doppler and duplex ultrasound were performed of bilateral carotid and vertebral arteries in the neck. COMPARISON:  None. FINDINGS: Criteria: Quantification of carotid stenosis is based on velocity parameters that correlate the residual internal carotid diameter with  NASCET-based stenosis levels, using the diameter of the distal internal carotid lumen as the denominator for stenosis measurement. The following velocity measurements were obtained: RIGHT ICA: Peak systolic velocity 84 cm/sec, End diastolic velocity 34 cm/sec CCA: Peak systolic velocity 54 cm/sec SYSTOLIC ICA/CCA RATIO:  1.5 ECA: Peak systolic velocity 52 cm/sec LEFT ICA: Peak systolic velocity 70 cm/sec, End diastolic velocity 32 cm/sec CCA: 52 cm/sec SYSTOLIC ICA/CCA RATIO:  1.3 ECA: 60 cm/sec RIGHT CAROTID ARTERY: Moderate focal echogenic atherosclerotic plaque formation in the proximal internal carotid artery. No significant tortuosity. Normal low resistance waveforms. RIGHT VERTEBRAL ARTERY:  Antegrade flow. LEFT CAROTID ARTERY: No atherosclerotic plaque formation. No significant tortuosity. Normal low resistance waveforms. LEFT VERTEBRAL ARTERY:  Antegrade flow. Upper extremity non-invasive blood pressures: Not obtained. Few scattered, non pathologically enlarged or morphologically abnormal left cervical lymph nodes are visualized IMPRESSION: 1. Right carotid artery system: Less than 50% stenosis secondary to moderate focal atherosclerotic plaque formation in the proximal internal carotid artery. 2. Left carotid artery system: Patent without significant atherosclerotic plaque formation. 3.  Vertebral artery system: Patent with antegrade flow bilaterally. Wallace Cullens, MD [facility] Radiology Electronically Signed   By: [facility] MD   On: 06/20/2020 10:11    Labs No results found for this or any previous visit (from the past 2160 hour(s)).  Assessment/Plan:  PAD (peripheral artery disease) (HCC) Was told by home health screening that she had moderate peripheral arterial disease.  Feet are cool frequently and she does have pain with walking.  Check ABIs at her convenience.  Essential hypertension blood pressure control important in reducing the  progression of atherosclerotic disease. On appropriate oral medications.   Mixed hyperlipidemia lipid control important in reducing the progression of atherosclerotic disease. Continue statin therapy   Carotid stenosis a carotid duplex which I have reviewed.  This demonstrates mild plaque in the right carotid bifurcation consistent with 1 to 49% right ICA  stenosis by the velocity criteria.  No significant disease was seen in the left carotid artery.  Both vertebral arteries were antegrade with good flow.  Aspirin and statin agent should be appropriate therapy.  This can be checked annually.  Family history of abdominal aortic aneurysm (AAA) The patient's mother had an abdominal aortic aneurysm she does have intermittent abdominal pain and swelling checking an aortic duplex in the near future at her convenience would be prudent.      Festus Barren 07/12/2020, 3:33 PM   This note was created with Dragon medical transcription system.  Any errors from dictation are unintentional.

## 2020-07-12 NOTE — Assessment & Plan Note (Signed)
lipid control important in reducing the progression of atherosclerotic disease. Continue statin therapy  

## 2020-07-12 NOTE — Assessment & Plan Note (Signed)
blood pressure control important in reducing the progression of atherosclerotic disease. On appropriate oral medications.  

## 2020-07-12 NOTE — Assessment & Plan Note (Signed)
a carotid duplex which I have reviewed.  This demonstrates mild plaque in the right carotid bifurcation consistent with 1 to 49% right ICA stenosis by the velocity criteria.  No significant disease was seen in the left carotid artery.  Both vertebral arteries were antegrade with good flow.  Aspirin and statin agent should be appropriate therapy.  This can be checked annually.

## 2020-07-12 NOTE — Assessment & Plan Note (Signed)
The patient's mother had an abdominal aortic aneurysm she does have intermittent abdominal pain and swelling checking an aortic duplex in the near future at her convenience would be prudent.

## 2020-07-12 NOTE — Assessment & Plan Note (Signed)
Was told by home health screening that she had moderate peripheral arterial disease.  Feet are cool frequently and she does have pain with walking.  Check ABIs at her convenience.

## 2020-07-24 ENCOUNTER — Encounter (INDEPENDENT_AMBULATORY_CARE_PROVIDER_SITE_OTHER): Payer: Self-pay | Admitting: Nurse Practitioner

## 2020-07-24 ENCOUNTER — Ambulatory Visit (INDEPENDENT_AMBULATORY_CARE_PROVIDER_SITE_OTHER): Payer: Medicare PPO | Admitting: Nurse Practitioner

## 2020-07-24 ENCOUNTER — Other Ambulatory Visit: Payer: Self-pay

## 2020-07-24 ENCOUNTER — Ambulatory Visit (INDEPENDENT_AMBULATORY_CARE_PROVIDER_SITE_OTHER): Payer: Medicare PPO

## 2020-07-24 VITALS — BP 132/79 | HR 78 | Ht 63.0 in | Wt 158.0 lb

## 2020-07-24 DIAGNOSIS — Z8249 Family history of ischemic heart disease and other diseases of the circulatory system: Secondary | ICD-10-CM

## 2020-07-24 DIAGNOSIS — I10 Essential (primary) hypertension: Secondary | ICD-10-CM

## 2020-07-24 DIAGNOSIS — I739 Peripheral vascular disease, unspecified: Secondary | ICD-10-CM

## 2020-07-24 DIAGNOSIS — E782 Mixed hyperlipidemia: Secondary | ICD-10-CM

## 2020-07-24 DIAGNOSIS — I6521 Occlusion and stenosis of right carotid artery: Secondary | ICD-10-CM

## 2020-07-24 NOTE — Progress Notes (Signed)
Subjective:    Patient ID: Lori Kidd, female    DOB: [birth year], 70 y.o.   MRN: [id number] Chief Complaint  Patient presents with   Follow-up    Pt conv aaa abi     Kiriana Worthington is a 70 year old female that presents today for follow-up in regards to a family history of abdominal aortic aneurysm.  The patient has been having some abdominal pain and was concerned this could be related to an abdominal aortic aneurysm.  The patient has also had a health assessment by her [facility] nurse and it was suggested that based on the studies the patient had peripheral arterial disease.  The patient denies any claudication or rest pain.  She denies any open wounds or ulcerations.  There are no signs and symptoms of distal embolization.  Today noninvasive studies show no evidence of an abdominal aortic aneurysm.  Largest abdominal aortic diameter is 2.1 cm.  Today the patient has an ABI of 1.10 on the right and 1.05 on the left.  There is a TBI of 1.07 on the right and 0.96 on the left.  The patient has triphasic tibial artery waveforms bilaterally with good toe waveforms bilaterally.   Review of Systems  Gastrointestinal:  Positive for abdominal pain.  All other systems reviewed and are negative.     Objective:   Physical Exam Vitals reviewed.  HENT:     Head: Normocephalic.  Cardiovascular:     Rate and Rhythm: Normal rate.     Pulses: Normal pulses.  Pulmonary:     Effort: Pulmonary effort is normal.  Musculoskeletal:        General: Normal range of motion.  Skin:    General: Skin is warm and dry.     Capillary Refill: Capillary refill takes less than 2 seconds.  Neurological:     Mental Status: She is alert and oriented to person, place, and time.  Psychiatric:        Mood and Affect: Mood normal.        Behavior: Behavior normal.        Thought Content: Thought content normal.        Judgment: Judgment normal.    BP 132/79   Pulse 78   Ht 5\' 3"  (1.6 m)   Wt 158 lb  (71.7 kg)   BMI 27.99 kg/m   Past Medical History:  Diagnosis Date   Arthritis    Asthma    High blood pressure    High cholesterol    Hypothyroidism    Pneumonia     Social History   Socioeconomic History   Marital status: Married    Spouse name: Not on file   Number of children: Not on file   Years of education: Not on file   Highest education level: Not on file  Occupational History   Occupation: Retired  Tobacco Use   Smoking status: Former    Pack years: 0.00    Types: Cigarettes   Smokeless tobacco: Never  Vaping Use   Vaping Use: Never used  Substance and Sexual Activity   Alcohol use: Yes    Alcohol/week: 3.0 standard drinks    Types: 3 Cans of beer per week   Drug use: Never   Sexual activity: Not on file  Other Topics Concern   Not on file  Social History Narrative   Not on file   Social Determinants of Health   Financial Resource Strain: Not on file  Food Insecurity:  Not on file  Transportation Needs: Not on file  Physical Activity: Not on file  Stress: Not on file  Social Connections: Not on file  Intimate Partner Violence: Not on file    Past Surgical History:  Procedure Laterality Date   APPENDECTOMY  1982   KNEE ARTHROPLASTY Right 01/24/2018   Procedure: COMPUTER ASSISTED TOTAL KNEE ARTHROPLASTY;  Surgeon: Donato Heinz, MD;  Location: [facility] ORS;  Service: Orthopedics;  Laterality: Right;   SIGMOID RESECTION / RECTOPEXY  1998   TONSILLECTOMY  1962   TUBAL LIGATION  1983   VAGINAL HYSTERECTOMY  2000    Family History  Problem Relation Age of Onset   Breast cancer Sister        early 70's   Pneumonia Mother    Pneumonia Father     Allergies  Allergen Reactions   Ace Inhibitors Cough   Penicillins Rash and Other (See Comments)    Redness Has patient had a PCN reaction causing immediate rash, facial/tongue/throat swelling, SOB or lightheadedness with hypotension: Yes Has patient had a PCN reaction causing severe rash  involving mucus membranes or skin necrosis: No Has patient had a PCN reaction that required hospitalization: No Has patient had a PCN reaction occurring within the last 10 years: No If all of the above answers are "NO", then may proceed with Cephalosporin use.     CBC Latest Ref Rng & Units 01/26/2018 01/12/2018 10/27/2012  WBC 4.0 - 10.5 K/uL 10.8(H) 6.9 7.6  Hemoglobin 12.0 - 15.0 g/dL 10.0(L) 14.7 13.9  Hematocrit 36.0 - 46.0 % 30.2(L) 43.6 40.3  Platelets 150 - 400 K/uL 206 302 258      CMP     Component Value Date/Time   NA 139 01/12/2018 0855   NA 139 10/27/2012 1816   K 3.7 01/12/2018 0855   K 3.6 10/27/2012 1816   CL 103 01/12/2018 0855   CL 107 10/27/2012 1816   CO2 29 01/12/2018 0855   CO2 22 10/27/2012 1816   GLUCOSE 90 01/12/2018 0855   GLUCOSE 99 10/27/2012 1816   BUN 25 (H) 01/12/2018 0855   BUN 20 (H) 10/27/2012 1816   CREATININE 0.81 01/12/2018 0855   CREATININE 0.70 10/27/2012 1816   CALCIUM 9.6 01/12/2018 0855   CALCIUM 9.3 10/27/2012 1816   PROT 8.1 01/12/2018 0855   PROT 7.3 10/27/2012 1816   ALBUMIN 4.8 01/12/2018 0855   ALBUMIN 3.9 10/27/2012 1816   AST 21 01/12/2018 0855   AST 20 10/27/2012 1816   ALT 19 01/12/2018 0855   ALT 25 10/27/2012 1816   ALKPHOS 72 01/12/2018 0855   ALKPHOS 90 10/27/2012 1816   BILITOT 1.0 01/12/2018 0855   BILITOT 0.3 10/27/2012 1816   GFRNONAA >60 01/12/2018 0855   GFRNONAA >60 10/27/2012 1816   GFRAA >60 01/12/2018 0855   GFRAA >60 10/27/2012 1816     No results found.     Assessment & Plan:   1. Stenosis of right carotid artery Patient does have known mild stenosis of the right ICA.  Previous study showed a 1 to 49% stenosis.  We will continue to follow this annually.  Patient to follow-up in 1 year.  2. PAD (peripheral artery disease) (HCC) The patient previously had an abnormal screening by her nurse with [facility]  It was suggestive that the patient had peripheral arterial disease.  Noninvasive studies  today show no evidence of peripheral disease in her lower extremities.  Patient will follow up on an as-needed basis in  regards to  3. Family history of abdominal aortic aneurysm (AAA) The patient does have a significant family history of peripheral arterial disease, noninvasive studies today reveal the patient has no evidence of an abdominal aortic aneurysm.  Largest abdominal aortic diameter is 2.1 cm.  4. Essential hypertension Continue antihypertensive medications as already ordered, these medications have been reviewed and there are no changes at this time.   5. Mixed hyperlipidemia Continue statin as ordered and reviewed, no changes at this time    Current Outpatient Medications on File Prior to Visit  Medication Sig Dispense Refill   Acetaminophen 500 MG capsule Take by mouth every 6 (six) hours as needed for fever.     albuterol (PROVENTIL HFA;VENTOLIN HFA) 108 (90 Base) MCG/ACT inhaler Inhale 2 puffs into the lungs every 6 (six) hours as needed for wheezing or shortness of breath.     aspirin EC 81 MG tablet Take 81 mg by mouth daily. Swallow whole.     Cholecalciferol 25 MCG (1000 UT) capsule Take by mouth.     CRANBERRY PO Take 900 mg by mouth daily.     ELDERBERRY PO Take 100 mg by mouth daily.     EPINEPHrine 0.3 mg/0.3 mL IJ SOAJ injection Inject 0.3 mg into the muscle once.     fluticasone (FLONASE) 50 MCG/ACT nasal spray Place 1 spray into both nostrils daily.     Fluticasone Propionate, Inhal, 250 MCG/BLIST AEPB Inhale 1 puff into the lungs daily.     levothyroxine (SYNTHROID, LEVOTHROID) 112 MCG tablet Take 112 mcg by mouth daily before breakfast.      loratadine (CLARITIN) 10 MG tablet Take 10 mg by mouth daily as needed for allergies.     Melatonin 5 MG CAPS Take by mouth at bedtime as needed.     naproxen sodium (ALEVE) 220 MG tablet Take 220 mg by mouth daily as needed.     Omega-3 1000 MG CAPS Take 2,000 mg by mouth daily.      oxyCODONE (OXY IR/ROXICODONE) 5 MG  immediate release tablet Take 1 tablet (5 mg total) by mouth every 4 (four) hours as needed for moderate pain (pain score 4-6). 30 tablet 0   pravastatin (PRAVACHOL) 40 MG tablet Take 40 mg by mouth daily.      traMADol (ULTRAM) 50 MG tablet Take 1-2 tablets (50-100 mg total) by mouth every 6 (six) hours as needed for moderate pain. 60 tablet 0   triamterene-hydrochlorothiazide (MAXZIDE-25) 37.5-25 MG tablet Take 1 tablet by mouth daily.     enoxaparin (LOVENOX) 40 MG/0.4ML injection Inject 0.4 mLs (40 mg total) into the skin daily for 14 days. 14 Syringe 0   No current facility-administered medications on file prior to visit.    There are no Patient Instructions on file for this visit. No follow-ups on file.   Georgiana Spinner, NP

## 2020-08-26 ENCOUNTER — Other Ambulatory Visit: Payer: Self-pay | Admitting: Family Medicine

## 2020-08-26 DIAGNOSIS — Z1231 Encounter for screening mammogram for malignant neoplasm of breast: Secondary | ICD-10-CM

## 2020-08-30 ENCOUNTER — Ambulatory Visit
Admission: RE | Admit: 2020-08-30 | Discharge: 2020-08-30 | Disposition: A | Discharge status: Home / Self Care | Payer: Medicare PPO | Source: Ambulatory Visit | Attending: Family Medicine | Admitting: Family Medicine

## 2020-08-30 ENCOUNTER — Other Ambulatory Visit: Payer: Self-pay

## 2020-08-30 DIAGNOSIS — Z1231 Encounter for screening mammogram for malignant neoplasm of breast: Secondary | ICD-10-CM | POA: Insufficient documentation

## 2021-01-28 ENCOUNTER — Encounter: Payer: Self-pay | Admitting: Internal Medicine

## 2021-01-29 ENCOUNTER — Ambulatory Visit: Payer: Medicare PPO | Admitting: Anesthesiology

## 2021-01-29 ENCOUNTER — Ambulatory Visit
Admission: RE | Admit: 2021-01-29 | Discharge: 2021-01-29 | Disposition: A | Discharge status: Home / Self Care | Payer: Medicare PPO | Attending: Internal Medicine | Admitting: Internal Medicine

## 2021-01-29 ENCOUNTER — Encounter
Admission: RE | Disposition: A | Discharge status: Home / Self Care | Payer: Self-pay | Source: Home / Self Care | Attending: Internal Medicine

## 2021-01-29 ENCOUNTER — Encounter: Payer: Self-pay | Admitting: Internal Medicine

## 2021-01-29 ENCOUNTER — Other Ambulatory Visit: Payer: Self-pay

## 2021-01-29 DIAGNOSIS — Z1211 Encounter for screening for malignant neoplasm of colon: Secondary | ICD-10-CM | POA: Diagnosis present

## 2021-01-29 DIAGNOSIS — I10 Essential (primary) hypertension: Secondary | ICD-10-CM | POA: Insufficient documentation

## 2021-01-29 DIAGNOSIS — K5730 Diverticulosis of large intestine without perforation or abscess without bleeding: Secondary | ICD-10-CM | POA: Diagnosis not present

## 2021-01-29 DIAGNOSIS — E7800 Pure hypercholesterolemia, unspecified: Secondary | ICD-10-CM | POA: Insufficient documentation

## 2021-01-29 DIAGNOSIS — Z87891 Personal history of nicotine dependence: Secondary | ICD-10-CM | POA: Diagnosis not present

## 2021-01-29 DIAGNOSIS — I739 Peripheral vascular disease, unspecified: Secondary | ICD-10-CM | POA: Diagnosis not present

## 2021-01-29 DIAGNOSIS — M1990 Unspecified osteoarthritis, unspecified site: Secondary | ICD-10-CM | POA: Diagnosis not present

## 2021-01-29 DIAGNOSIS — K640 First degree hemorrhoids: Secondary | ICD-10-CM | POA: Insufficient documentation

## 2021-01-29 DIAGNOSIS — J45909 Unspecified asthma, uncomplicated: Secondary | ICD-10-CM | POA: Diagnosis not present

## 2021-01-29 DIAGNOSIS — E039 Hypothyroidism, unspecified: Secondary | ICD-10-CM | POA: Diagnosis not present

## 2021-01-29 DIAGNOSIS — Z86010 Personal history of colonic polyps: Secondary | ICD-10-CM | POA: Diagnosis not present

## 2021-01-29 HISTORY — PX: COLONOSCOPY WITH PROPOFOL: SHX5780

## 2021-01-29 SURGERY — COLONOSCOPY WITH PROPOFOL
Anesthesia: General

## 2021-01-29 MED ORDER — PROPOFOL 500 MG/50ML IV EMUL
INTRAVENOUS | Status: AC
  Filled 2021-01-29: qty 50

## 2021-01-29 MED ORDER — PROPOFOL 500 MG/50ML IV EMUL
INTRAVENOUS | Status: DC | PRN
  Administered 2021-01-29: 125 ug/kg/min via INTRAVENOUS

## 2021-01-29 MED ORDER — PROPOFOL 10 MG/ML IV BOLUS
INTRAVENOUS | Status: DC | PRN
  Administered 2021-01-29: 90 mg via INTRAVENOUS

## 2021-01-29 MED ORDER — LIDOCAINE HCL (CARDIAC) PF 100 MG/5ML IV SOSY
PREFILLED_SYRINGE | INTRAVENOUS | Status: DC | PRN
  Administered 2021-01-29: 50 mg via INTRAVENOUS

## 2021-01-29 MED ORDER — SODIUM CHLORIDE 0.9 % IV SOLN: INTRAVENOUS | Status: DC

## 2021-01-29 MED ORDER — LIDOCAINE HCL (PF) 2 % IJ SOLN
INTRAMUSCULAR | Status: AC
  Filled 2021-01-29: qty 5

## 2021-01-29 NOTE — Op Note (Signed)
[facility] Gastroenterology Patient Name: Lori SpryJennie Rispoli Procedure Date: 01/29/2021 10:33 AM MRN: [id number] Account #: [id number] Date of Birth: 03/06/1950 Admit Type: Outpatient Age: 71 Room: [facility] END[facility] ROOM 2 Gender: Female Note Status: Finalized Instrument Name: Nelda MarseilleColonscope [id number] Procedure:             Colonoscopy Indications:           Surveillance: Personal history of adenomatous polyps                         on last colonoscopy > 5 years ago Providers:             Royce Macadamiaeodoro K. Lariah Fleer MD, MD Medicines:             Propofol per Anesthesia Complications:         No immediate complications. Procedure:             Pre-Anesthesia Assessment:                        - The risks and benefits of the procedure and the                         sedation options and risks were discussed with the                         patient. All questions were answered and informed                         consent was obtained.                        - Patient identification and proposed procedure were                         verified prior to the procedure by the nurse. The                         procedure was verified in the procedure room.                        - ASA Grade Assessment: III - A patient with severe                         systemic disease.                        - After reviewing the risks and benefits, the patient                         was deemed in satisfactory condition to undergo the                         procedure.                        After obtaining informed consent, the colonoscope was                         passed under direct vision. Throughout the procedure,  the patient's blood pressure, pulse, and oxygen                         saturations were monitored continuously. The                         Colonoscope was introduced through the anus and                         advanced to the the cecum, identified by appendiceal                          orifice and ileocecal valve. The colonoscopy was                         performed without difficulty. The patient tolerated                         the procedure well. The quality of the bowel                         preparation was good. The ileocecal valve, appendiceal                         orifice, and rectum were photographed. Findings:      The perianal and digital rectal examinations were normal. Pertinent       negatives include normal sphincter tone and no palpable rectal lesions.      Non-bleeding internal hemorrhoids were found during retroflexion. The       hemorrhoids were Grade I (internal hemorrhoids that do not prolapse).      Many small and large-mouthed diverticula were found in the sigmoid colon.      A few small-mouthed diverticula were found in the ascending colon.      The exam was otherwise without abnormality. Impression:            - Non-bleeding internal hemorrhoids.                        - Diverticulosis in the sigmoid colon.                        - Diverticulosis in the ascending colon.                        - The examination was otherwise normal.                        - No specimens collected. Recommendation:        - Patient has a contact number available for                         emergencies. The signs and symptoms of potential                         delayed complications were discussed with the patient.                         Return to normal activities tomorrow. Written  discharge instructions were provided to the patient.                        - Resume previous diet.                        - Continue present medications.                        - Repeat colonoscopy in 10 years for screening                         purposes.                        - Return to GI office PRN.                        - The findings and recommendations were discussed with                         the patient. Procedure Code(s):      --- Professional ---                        O1751, Colorectal cancer screening; colonoscopy on                         individual at high risk Diagnosis Code(s):     --- Professional ---                        K57.30, Diverticulosis of large intestine without                         perforation or abscess without bleeding                        K64.0, First degree hemorrhoids                        Z86.010, Personal history of colonic polyps CPT copyright 2019 American Medical Association. All rights reserved. The codes documented in this report are preliminary and upon coder review may  be revised to meet current compliance requirements. Stanton Kidney MD, MD 01/29/2021 11:10:23 AM This report has been signed electronically. Number of Addenda: 0 Note Initiated On: 01/29/2021 10:33 AM Scope Withdrawal Time: 0 hours 4 minutes 32 seconds  Total Procedure Duration: 0 hours 10 minutes 41 seconds  Estimated Blood Loss:  Estimated blood loss: none.      [facility]

## 2021-01-29 NOTE — Interval H&P Note (Signed)
History and Physical Interval Note:  01/29/2021 10:47 AM  Lori Kidd  has presented today for surgery, with the diagnosis of H/O Adenomatous Polyps (Z86.010).  The various methods of treatment have been discussed with the patient and family. After consideration of risks, benefits and other options for treatment, the patient has consented to  Procedure(s): COLONOSCOPY WITH PROPOFOL (N/A) as a surgical intervention.  The patient's history has been reviewed, patient examined, no change in status, stable for surgery.  I have reviewed the patient's chart and labs.  Questions were answered to the patient's satisfaction.     [place], [place]

## 2021-01-29 NOTE — H&P (Signed)
Outpatient short stay form Pre-procedure 01/29/2021 10:38 AM Lori Kidd, M.D.  Primary Physician: Marisue Ivan, M.D.  Reason for visit:  Personal history of colon polyps  History of present illness:                            Patient presents for colonoscopy for a personal hx of colon polyps. The patient denies abdominal pain, abnormal weight loss or rectal bleeding.     No current facility-administered medications for this encounter.  Medications Prior to Admission  Medication Sig Dispense Refill Last Dose   aspirin EC 81 MG tablet Take 81 mg by mouth daily. Swallow whole.   Past Week   Cholecalciferol 25 MCG (1000 UT) capsule Take by mouth.   Past Week   CRANBERRY PO Take 900 mg by mouth daily.   Past Month   levothyroxine (SYNTHROID, LEVOTHROID) 112 MCG tablet Take 112 mcg by mouth daily before breakfast.    01/28/2021   Omega-3 1000 MG CAPS Take 2,000 mg by mouth daily.    Past Week   pravastatin (PRAVACHOL) 40 MG tablet Take 40 mg by mouth daily.    01/28/2021   triamterene-hydrochlorothiazide (MAXZIDE-25) 37.5-25 MG tablet Take 1 tablet by mouth daily.   01/28/2021   Acetaminophen 500 MG capsule Take by mouth every 6 (six) hours as needed for fever.      albuterol (PROVENTIL HFA;VENTOLIN HFA) 108 (90 Base) MCG/ACT inhaler Inhale 2 puffs into the lungs every 6 (six) hours as needed for wheezing or shortness of breath.      ELDERBERRY PO Take 100 mg by mouth daily.      enoxaparin (LOVENOX) 40 MG/0.4ML injection Inject 0.4 mLs (40 mg total) into the skin daily for 14 days. 14 Syringe 0    EPINEPHrine 0.3 mg/0.3 mL IJ SOAJ injection Inject 0.3 mg into the muscle once.      fluticasone (FLONASE) 50 MCG/ACT nasal spray Place 1 spray into both nostrils daily.      Fluticasone Propionate, Inhal, 250 MCG/BLIST AEPB Inhale 1 puff into the lungs daily.      loratadine (CLARITIN) 10 MG tablet Take 10 mg by mouth daily as needed for allergies.      Melatonin 5 MG CAPS Take by mouth at  bedtime as needed.      naproxen sodium (ALEVE) 220 MG tablet Take 220 mg by mouth daily as needed.      oxyCODONE (OXY IR/ROXICODONE) 5 MG immediate release tablet Take 1 tablet (5 mg total) by mouth every 4 (four) hours as needed for moderate pain (pain score 4-6). 30 tablet 0    traMADol (ULTRAM) 50 MG tablet Take 1-2 tablets (50-100 mg total) by mouth every 6 (six) hours as needed for moderate pain. 60 tablet 0      Allergies  Allergen Reactions   Ace Inhibitors Cough   Penicillins Rash and Other (See Comments)    Redness Has patient had a PCN reaction causing immediate rash, facial/tongue/throat swelling, SOB or lightheadedness with hypotension: Yes Has patient had a PCN reaction causing severe rash involving mucus membranes or skin necrosis: No Has patient had a PCN reaction that required hospitalization: No Has patient had a PCN reaction occurring within the last 10 years: No If all of the above answers are "NO", then may proceed with Cephalosporin use.      Past Medical History:  Diagnosis Date   Arthritis    Asthma  High blood pressure    High cholesterol    Hypothyroidism    Pneumonia     Review of systems:  Otherwise negative.    Physical Exam  Gen: Alert, oriented. Appears stated age.  HEENT: [place]/AT. PERRLA. Lungs: CTA, no wheezes. CV: RR nl S1, S2. Abd: soft, benign, no masses. BS+ Ext: No edema. Pulses 2+    Planned procedures: Proceed with colonoscopy. The patient understands the nature of the planned procedure, indications, risks, alternatives and potential complications including but not limited to bleeding, infection, perforation, damage to internal organs and possible oversedation/side effects from anesthesia. The patient agrees and gives consent to proceed.  Please refer to procedure notes for findings, recommendations and patient disposition/instructions.     Lori Kidd, M.D. Gastroenterology 01/29/2021  10:38 AM

## 2021-01-29 NOTE — Transfer of Care (Signed)
Immediate Anesthesia Transfer of Care Note  Patient: Lorenza Shakir  Procedure(s) Performed: COLONOSCOPY WITH PROPOFOL  Patient Location: PACU  Anesthesia Type:MAC  Level of Consciousness: awake, alert  and oriented  Airway & Oxygen Therapy: Patient Spontanous Breathing  Post-op Assessment: Report given to RN and Post -op Vital signs reviewed and stable  Post vital signs: stable  Last Vitals:  Vitals Value Taken Time  BP 108/64 01/29/21 1107  Temp    Pulse 73 01/29/21 1108  Resp 17 01/29/21 1108  SpO2 100 % 01/29/21 1108  Vitals shown include unvalidated device data.  Last Pain:  Vitals:   01/29/21 1037  TempSrc: Temporal  PainSc: 0-No pain         Complications: No notable events documented.

## 2021-01-29 NOTE — Anesthesia Preprocedure Evaluation (Signed)
Anesthesia Evaluation  Patient identified by MRN, date of birth, ID band Patient awake    Reviewed: Allergy & Precautions, NPO status , Patient's Chart, lab work & pertinent test results  History of Anesthesia Complications Negative for: history of anesthetic complications  Airway Mallampati: III  TM Distance: <3 FB Neck ROM: full    Dental  (+) Chipped   Pulmonary neg shortness of breath, asthma , former smoker,    Pulmonary exam normal        Cardiovascular Exercise Tolerance: Good hypertension, (-) angina+ Peripheral Vascular Disease  (-) Past MI Normal cardiovascular exam     Neuro/Psych negative neurological ROS  negative psych ROS   GI/Hepatic negative GI ROS, Neg liver ROS, neg GERD  ,  Endo/Other  Hypothyroidism   Renal/GU negative Renal ROS  negative genitourinary   Musculoskeletal  (+) Arthritis ,   Abdominal   Peds  Hematology negative hematology ROS (+)   Anesthesia Other Findings Past Medical History: No date: Arthritis No date: Asthma No date: High blood pressure No date: High cholesterol No date: Hypothyroidism No date: Pneumonia  Past Surgical History: 1982: APPENDECTOMY 01/24/2018: KNEE ARTHROPLASTY; Right     Comment:  Procedure: COMPUTER ASSISTED TOTAL KNEE ARTHROPLASTY;                Surgeon: Donato Heinz, MD;  Location: [facility] ORS;                Service: Orthopedics;  Laterality: Right; 1998: SIGMOID RESECTION / RECTOPEXY 1962: TONSILLECTOMY 1983: TUBAL LIGATION 2000: VAGINAL HYSTERECTOMY     Reproductive/Obstetrics negative OB ROS                             Anesthesia Physical Anesthesia Plan  ASA: 3  Anesthesia Plan: General   Post-op Pain Management:    Induction: Intravenous  PONV Risk Score and Plan: Propofol infusion and TIVA  Airway Management Planned: Natural Airway and Nasal Cannula  Additional Equipment:   Intra-op Plan:    Post-operative Plan:   Informed Consent: I have reviewed the patients History and Physical, chart, labs and discussed the procedure including the risks, benefits and alternatives for the proposed anesthesia with the patient or authorized representative who has indicated his/her understanding and acceptance.     Dental Advisory Given  Plan Discussed with: Anesthesiologist, CRNA and Surgeon  Anesthesia Plan Comments: (Patient consented for risks of anesthesia including but not limited to:  - adverse reactions to medications - risk of airway placement if required - damage to eyes, teeth, lips or other oral mucosa - nerve damage due to positioning  - sore throat or hoarseness - Damage to heart, brain, nerves, lungs, other parts of body or loss of life  Patient voiced understanding.)        Anesthesia Quick Evaluation

## 2021-01-29 NOTE — Anesthesia Postprocedure Evaluation (Signed)
Anesthesia Post Note  Patient: Lori Kidd  Procedure(s) Performed: COLONOSCOPY WITH PROPOFOL  Patient location during evaluation: Endoscopy Anesthesia Type: General Level of consciousness: awake and alert Pain management: pain level controlled Vital Signs Assessment: post-procedure vital signs reviewed and stable Respiratory status: spontaneous breathing, nonlabored ventilation, respiratory function stable and patient connected to nasal cannula oxygen Cardiovascular status: blood pressure returned to baseline and stable Postop Assessment: no apparent nausea or vomiting Anesthetic complications: no   No notable events documented.   Last Vitals:  Vitals:   01/29/21 1119 01/29/21 1130  BP: 123/88 119/74  Pulse: 65 67  Resp: 12 15  Temp:    SpO2: 100% 98%    Last Pain:  Vitals:   01/29/21 1130  TempSrc:   PainSc: 0-No pain                 Cleda Mccreedy Geary Rufo

## 2021-01-30 ENCOUNTER — Encounter: Payer: Self-pay | Admitting: Internal Medicine

## 2021-03-06 ENCOUNTER — Encounter: Payer: Self-pay | Admitting: Ophthalmology

## 2021-03-13 NOTE — Discharge Instructions (Signed)

## 2021-03-17 ENCOUNTER — Encounter
Admission: RE | Disposition: A | Discharge status: Home / Self Care | Payer: Self-pay | Source: Home / Self Care | Attending: Ophthalmology

## 2021-03-17 ENCOUNTER — Encounter: Payer: Self-pay | Admitting: Ophthalmology

## 2021-03-17 ENCOUNTER — Ambulatory Visit: Payer: Medicare PPO | Admitting: Anesthesiology

## 2021-03-17 ENCOUNTER — Other Ambulatory Visit: Payer: Self-pay

## 2021-03-17 ENCOUNTER — Ambulatory Visit
Admission: RE | Admit: 2021-03-17 | Discharge: 2021-03-17 | Disposition: A | Discharge status: Home / Self Care | Payer: Medicare PPO | Attending: Ophthalmology | Admitting: Ophthalmology

## 2021-03-17 DIAGNOSIS — E039 Hypothyroidism, unspecified: Secondary | ICD-10-CM | POA: Insufficient documentation

## 2021-03-17 DIAGNOSIS — I10 Essential (primary) hypertension: Secondary | ICD-10-CM | POA: Insufficient documentation

## 2021-03-17 DIAGNOSIS — Z87891 Personal history of nicotine dependence: Secondary | ICD-10-CM | POA: Diagnosis not present

## 2021-03-17 DIAGNOSIS — J45909 Unspecified asthma, uncomplicated: Secondary | ICD-10-CM | POA: Diagnosis not present

## 2021-03-17 DIAGNOSIS — H2511 Age-related nuclear cataract, right eye: Secondary | ICD-10-CM | POA: Insufficient documentation

## 2021-03-17 HISTORY — DX: Meniere's disease, unspecified ear: H81.09

## 2021-03-17 HISTORY — PX: CATARACT EXTRACTION W/PHACO: SHX586

## 2021-03-17 SURGERY — PHACOEMULSIFICATION, CATARACT, WITH IOL INSERTION
Anesthesia: Monitor Anesthesia Care | Site: Eye | Laterality: Right

## 2021-03-17 MED ORDER — ACETAMINOPHEN 160 MG/5ML PO SOLN: 325.0000 mg | ORAL | Status: DC | PRN

## 2021-03-17 MED ORDER — MOXIFLOXACIN HCL 0.5 % OP SOLN
OPHTHALMIC | Status: DC | PRN
  Administered 2021-03-17: 0.2 mL via OPHTHALMIC

## 2021-03-17 MED ORDER — LIDOCAINE HCL (PF) 2 % IJ SOLN
INTRAOCULAR | Status: DC | PRN
  Administered 2021-03-17: 1 mL via INTRAOCULAR

## 2021-03-17 MED ORDER — SIGHTPATH DOSE#1 SODIUM HYALURONATE 10 MG/ML IO SOLUTION
PREFILLED_SYRINGE | INTRAOCULAR | Status: DC | PRN
  Administered 2021-03-17: 0.85 mL via INTRAOCULAR

## 2021-03-17 MED ORDER — ACETAMINOPHEN 325 MG PO TABS: 325.0000 mg | ORAL_TABLET | ORAL | Status: DC | PRN

## 2021-03-17 MED ORDER — SIGHTPATH DOSE#1 BSS IO SOLN
INTRAOCULAR | Status: DC | PRN
  Administered 2021-03-17: 15 mL

## 2021-03-17 MED ORDER — LACTATED RINGERS IV SOLN: INTRAVENOUS | Status: DC

## 2021-03-17 MED ORDER — SIGHTPATH DOSE#1 SODIUM HYALURONATE 23 MG/ML IO SOLUTION
PREFILLED_SYRINGE | INTRAOCULAR | Status: DC | PRN
  Administered 2021-03-17: 0.6 mL via INTRAOCULAR

## 2021-03-17 MED ORDER — SIGHTPATH DOSE#1 BSS IO SOLN
INTRAOCULAR | Status: DC | PRN
  Administered 2021-03-17: 61 mL via OPHTHALMIC

## 2021-03-17 MED ORDER — TETRACAINE HCL 0.5 % OP SOLN
1.0000 [drp] | OPHTHALMIC | Status: DC | PRN
  Administered 2021-03-17 (×3): 1 [drp] via OPHTHALMIC

## 2021-03-17 MED ORDER — ARMC OPHTHALMIC DILATING DROPS
1.0000 "application " | OPHTHALMIC | Status: DC | PRN
  Administered 2021-03-17 (×3): 1 via OPHTHALMIC

## 2021-03-17 MED ORDER — FENTANYL CITRATE (PF) 100 MCG/2ML IJ SOLN
INTRAMUSCULAR | Status: DC | PRN
  Administered 2021-03-17: 50 ug via INTRAVENOUS

## 2021-03-17 MED ORDER — MIDAZOLAM HCL 2 MG/2ML IJ SOLN
INTRAMUSCULAR | Status: DC | PRN
  Administered 2021-03-17: 2 mg via INTRAVENOUS

## 2021-03-17 SURGICAL SUPPLY — 14 items
CATARACT SUITE SIGHTPATH (MISCELLANEOUS) ×2 IMPLANT
DISSECTOR HYDRO NUCLEUS 50X22 (MISCELLANEOUS) ×2 IMPLANT
FEE CATARACT SUITE SIGHTPATH (MISCELLANEOUS) ×1 IMPLANT
GLOVE SURG GAMMEX PI TX LF 7.5 (GLOVE) ×2 IMPLANT
GLOVE SURG SYN 8.5  E (GLOVE) ×1
GLOVE SURG SYN 8.5 E (GLOVE) ×1 IMPLANT
GLOVE SURG SYN 8.5 PF PI (GLOVE) ×1 IMPLANT
LENS IOL TECNIS EYHANCE 20.0 (Intraocular Lens) ×1 IMPLANT
NDL FILTER BLUNT 18X1 1/2 (NEEDLE) ×1 IMPLANT
NEEDLE FILTER BLUNT 18X 1/2SAF (NEEDLE) ×1
NEEDLE FILTER BLUNT 18X1 1/2 (NEEDLE) ×1 IMPLANT
SYR 3ML LL SCALE MARK (SYRINGE) ×2 IMPLANT
SYR 5ML LL (SYRINGE) ×2 IMPLANT
WATER STERILE IRR 250ML POUR (IV SOLUTION) ×2 IMPLANT

## 2021-03-17 NOTE — H&P (Signed)
[name]   Primary Care Physician:  Marisue Ivan, MD Ophthalmologist: Dr. Willey Blade  Pre-Procedure History & Physical: HPI:  Lori Kidd is a 71 y.o. female here for cataract surgery.   Past Medical History:  Diagnosis Date   Arthritis    Asthma    High blood pressure    High cholesterol    Hypothyroidism    Meniere disease    Pneumonia     Past Surgical History:  Procedure Laterality Date   APPENDECTOMY  1982   COLONOSCOPY     Q5YRS SINCE 1998   COLONOSCOPY WITH PROPOFOL N/A 01/29/2021   Procedure: COLONOSCOPY WITH PROPOFOL;  Surgeon: Tol[place], Boykin Nearing, MD;  Location: [facility] ENDOSCOPY;  Service: Gastroenterology;  Laterality: N/A;   KNEE ARTHROPLASTY Right 01/24/2018   Procedure: COMPUTER ASSISTED TOTAL KNEE ARTHROPLASTY;  Surgeon: Donato Heinz, MD;  Location: [facility] ORS;  Service: Orthopedics;  Laterality: Right;   SIGMOID RESECTION / RECTOPEXY  1998   TONSILLECTOMY  1962   TUBAL LIGATION  1983   VAGINAL HYSTERECTOMY  2000    Prior to Admission medications   Medication Sig Start Date End Date Taking? Authorizing Provider  Acetaminophen 500 MG capsule Take by mouth every 6 (six) hours as needed for fever.   Yes [provider]  albuterol (PROVENTIL HFA;VENTOLIN HFA) 108 (90 Base) MCG/ACT inhaler Inhale 2 puffs into the lungs every 6 (six) hours as needed for wheezing or shortness of breath.   Yes [provider]  aspirin EC 81 MG tablet Take 81 mg by mouth daily. Swallow whole.   Yes [provider]  Cholecalciferol 25 MCG (1000 UT) capsule Take by mouth.   Yes [provider]  CRANBERRY PO Take 900 mg by mouth daily.   Yes [provider]  fluticasone (FLONASE) 50 MCG/ACT nasal spray Place 1 spray into both nostrils daily.   Yes [provider]  levothyroxine (SYNTHROID, LEVOTHROID) 112 MCG tablet Take 112 mcg by mouth daily before breakfast.  03/15/17  Yes [provider]   loratadine (CLARITIN) 10 MG tablet Take 10 mg by mouth daily as needed for allergies.   Yes [provider]  naproxen sodium (ALEVE) 220 MG tablet Take 220 mg by mouth daily as needed.   Yes [provider]  Omega-3 1000 MG CAPS Take 2,000 mg by mouth daily.    Yes [provider]  pravastatin (PRAVACHOL) 40 MG tablet Take 40 mg by mouth daily.  01/08/17  Yes [provider]  triamterene-hydrochlorothiazide (MAXZIDE-25) 37.5-25 MG tablet Take 1 tablet by mouth daily. 01/08/17  Yes [provider]  ELDERBERRY PO Take 100 mg by mouth daily. Patient not taking: Reported on 03/06/2021    [provider]  enoxaparin (LOVENOX) 40 MG/0.4ML injection Inject 0.4 mLs (40 mg total) into the skin daily for 14 days. 01/27/18 02/10/18  [name], PA  EPINEPHrine 0.3 mg/0.3 mL IJ SOAJ injection Inject 0.3 mg into the muscle once.    [provider]  Fluticasone Propionate, Inhal, 250 MCG/BLIST AEPB Inhale 1 puff into the lungs daily.    [provider]  Melatonin 5 MG CAPS Take by mouth at bedtime as needed. Patient not taking: Reported on 03/06/2021    [provider]    Allergies as of 02/10/2021 - Review Complete 01/29/2021  Allergen Reaction Noted   Ace inhibitors Cough 09/04/2013   Penicillins Rash and Other (See Comments) 09/04/2013    Family History  Problem Relation  Age of Onset   Breast cancer Sister        early 33's   Pneumonia Mother    Pneumonia Father     Social History   Socioeconomic History   Marital status: Married    Spouse name: Not on file   Number of children: Not on file   Years of education: Not on file   Highest education level: Not on file  Occupational History   Occupation: Retired Runner, broadcasting/film/video  Tobacco Use   Smoking status: Former    Types: Cigarettes   Smokeless tobacco: Never  Vaping Use   Vaping Use: Never used  Substance and Sexual Activity   Alcohol use: Yes    Alcohol/week: 3.0  standard drinks    Types: 3 Cans of beer per week    Comment: NONE LAST 24HRS   Drug use: Never   Sexual activity: Not on file  Other Topics Concern   Not on file  Social History Narrative   Not on file   Social Determinants of Health   Financial Resource Strain: Not on file  Food Insecurity: Not on file  Transportation Needs: Not on file  Physical Activity: Not on file  Stress: Not on file  Social Connections: Not on file  Intimate Partner Violence: Not on file    Review of Systems: See HPI, otherwise negative ROS  Physical Exam: Ht 5\' 3"  (1.6 m)    Wt 64.4 kg    BMI 25.15 kg/m  General:   Alert, cooperative in NAD Head:  Normocephalic and atraumatic. Respiratory:  Normal work of breathing. Cardiovascular:  RRR  Impression/Plan: Lori Kidd is here for cataract surgery.  Risks, benefits, limitations, and alternatives regarding cataract surgery have been reviewed with the patient.  Questions have been answered.  All parties agreeable.   [name], MD  03/17/2021, 11:02 AM

## 2021-03-17 NOTE — Transfer of Care (Signed)
Immediate Anesthesia Transfer of Care Note  Patient: Lori Kidd  Procedure(s) Performed: CATARACT EXTRACTION PHACO AND INTRAOCULAR LENS PLACEMENT (IOC) RIGHT (Right: Eye)  Patient Location: PACU  Anesthesia Type: MAC  Level of Consciousness: awake, alert  and patient cooperative  Airway and Oxygen Therapy: Patient Spontanous Breathing and Patient connected to supplemental oxygen  Post-op Assessment: Post-op Vital signs reviewed, Patient's Cardiovascular Status Stable, Respiratory Function Stable, Patent Airway and No signs of Nausea or vomiting  Post-op Vital Signs: Reviewed and stable  Complications: No notable events documented.

## 2021-03-17 NOTE — Anesthesia Preprocedure Evaluation (Signed)
Anesthesia Evaluation  Patient identified by MRN, date of birth, ID band Patient awake    Reviewed: Allergy & Precautions, H&P , NPO status , Patient's Chart, lab work & pertinent test results, reviewed documented beta blocker date and time   Airway Mallampati: II  TM Distance: >3 FB Neck ROM: full    Dental no notable dental hx.    Pulmonary asthma , former smoker,    Pulmonary exam normal breath sounds clear to auscultation       Cardiovascular Exercise Tolerance: Good hypertension, Normal cardiovascular exam Rhythm:regular Rate:Normal     Neuro/Psych negative neurological ROS  negative psych ROS   GI/Hepatic negative GI ROS, Neg liver ROS,   Endo/Other  Hypothyroidism   Renal/GU negative Renal ROS  negative genitourinary   Musculoskeletal   Abdominal   Peds  Hematology negative hematology ROS (+)   Anesthesia Other Findings   Reproductive/Obstetrics negative OB ROS                             Anesthesia Physical Anesthesia Plan  ASA: 2  Anesthesia Plan: MAC   Post-op Pain Management:    Induction:   PONV Risk Score and Plan:   Airway Management Planned:   Additional Equipment:   Intra-op Plan:   Post-operative Plan:   Informed Consent: I have reviewed the patients History and Physical, chart, labs and discussed the procedure including the risks, benefits and alternatives for the proposed anesthesia with the patient or authorized representative who has indicated his/her understanding and acceptance.     Dental Advisory Given  Plan Discussed with: CRNA and Anesthesiologist  Anesthesia Plan Comments:         Anesthesia Quick Evaluation

## 2021-03-17 NOTE — Op Note (Signed)
OPERATIVE NOTE  Lori Kidd [id number] 03/17/2021   PREOPERATIVE DIAGNOSIS:  Nuclear sclerotic cataract right eye.  H25.11   POSTOPERATIVE DIAGNOSIS:    Nuclear sclerotic cataract right eye.     PROCEDURE:  Phacoemusification with posterior chamber intraocular lens placement of the right eye   LENS:   Implant Name Type Inv. Item Serial No. Manufacturer Lot No. LRB No. Used Action  LENS IOL TECNIS EYHANCE 20.0 - [id number] Intraocular Lens LENS IOL TECNIS EYHANCE 20.0 [id number] [employer]  Right 1 Implanted       Procedure(s) with comments: CATARACT EXTRACTION PHACO AND INTRAOCULAR LENS PLACEMENT (IOC) RIGHT (Right) - 4.96 00:35.7  DIB00 +20.0   ULTRASOUND TIME: 0 minutes 35 seconds.  CDE 11.96   SURGEON:  Willey Blade, MD, MPH  ANESTHESIOLOGIST: Anesthesiologist: Baxter Flattery, MD CRNA: Michaele Offer, CRNA   ANESTHESIA:  Topical with tetracaine drops augmented with 1% preservative-free intracameral lidocaine.  ESTIMATED BLOOD LOSS: less than 1 mL.   COMPLICATIONS:  None.   DESCRIPTION OF PROCEDURE:  The patient was identified in the holding room and transported to the operating room and placed in the supine position under the operating microscope.  The right eye was identified as the operative eye and it was prepped and draped in the usual sterile ophthalmic fashion.   A 1.0 millimeter clear-corneal paracentesis was made at the 10:30 position. 0.5 ml of preservative-free 1% lidocaine with epinephrine was injected into the anterior chamber.  The anterior chamber was filled with Healon 5 viscoelastic.  A 2.4 millimeter keratome was used to make a near-clear corneal incision at the 8:00 position.  A curvilinear capsulorrhexis was made with a cystotome and capsulorrhexis forceps.  Balanced salt solution was used to hydrodissect and hydrodelineate the nucleus.   Phacoemulsification was then used in stop and chop fashion to remove the lens nucleus and epinucleus.  The  remaining cortex was then removed using the irrigation and aspiration handpiece. Healon was then placed into the capsular bag to distend it for lens placement.  A lens was then injected into the capsular bag.  The remaining viscoelastic was aspirated.   Wounds were hydrated with balanced salt solution.  The anterior chamber was inflated to a physiologic pressure with balanced salt solution.   Intracameral vigamox 0.1 mL undiluted was injected into the eye and a drop placed onto the ocular surface.  No wound leaks were noted.  The patient was taken to the recovery room in stable condition without complications of anesthesia or surgery  Willey Blade 03/17/2021, 11:30 AM

## 2021-03-17 NOTE — Anesthesia Postprocedure Evaluation (Signed)
Anesthesia Post Note  Patient: Lori Kidd  Procedure(s) Performed: CATARACT EXTRACTION PHACO AND INTRAOCULAR LENS PLACEMENT (IOC) RIGHT (Right: Eye)     Patient location during evaluation: PACU Anesthesia Type: MAC Level of consciousness: awake and alert Pain management: pain level controlled Vital Signs Assessment: post-procedure vital signs reviewed and stable Respiratory status: spontaneous breathing, nonlabored ventilation, respiratory function stable and patient connected to nasal cannula oxygen Cardiovascular status: stable and blood pressure returned to baseline Postop Assessment: no apparent nausea or vomiting Anesthetic complications: no   No notable events documented.  Trecia Rogers

## 2021-03-18 ENCOUNTER — Encounter: Payer: Self-pay | Admitting: Ophthalmology

## 2021-06-10 ENCOUNTER — Other Ambulatory Visit: Payer: Self-pay | Admitting: Family Medicine

## 2021-06-10 DIAGNOSIS — Z1231 Encounter for screening mammogram for malignant neoplasm of breast: Secondary | ICD-10-CM

## 2021-07-11 ENCOUNTER — Encounter (INDEPENDENT_AMBULATORY_CARE_PROVIDER_SITE_OTHER): Payer: Medicare PPO

## 2021-07-11 ENCOUNTER — Ambulatory Visit (INDEPENDENT_AMBULATORY_CARE_PROVIDER_SITE_OTHER): Payer: Medicare PPO | Admitting: Vascular Surgery

## 2021-08-27 ENCOUNTER — Other Ambulatory Visit (INDEPENDENT_AMBULATORY_CARE_PROVIDER_SITE_OTHER): Payer: Self-pay | Admitting: Nurse Practitioner

## 2021-08-27 DIAGNOSIS — I6521 Occlusion and stenosis of right carotid artery: Secondary | ICD-10-CM

## 2021-08-29 ENCOUNTER — Ambulatory Visit (INDEPENDENT_AMBULATORY_CARE_PROVIDER_SITE_OTHER): Payer: Medicare PPO

## 2021-08-29 ENCOUNTER — Encounter (INDEPENDENT_AMBULATORY_CARE_PROVIDER_SITE_OTHER): Payer: Self-pay | Admitting: Vascular Surgery

## 2021-08-29 ENCOUNTER — Ambulatory Visit (INDEPENDENT_AMBULATORY_CARE_PROVIDER_SITE_OTHER): Payer: Medicare PPO | Admitting: Vascular Surgery

## 2021-08-29 VITALS — BP 136/76 | HR 66 | Resp 17 | Ht 63.0 in | Wt 147.0 lb

## 2021-08-29 DIAGNOSIS — I6521 Occlusion and stenosis of right carotid artery: Secondary | ICD-10-CM

## 2021-08-29 DIAGNOSIS — I10 Essential (primary) hypertension: Secondary | ICD-10-CM | POA: Diagnosis not present

## 2021-08-29 DIAGNOSIS — E782 Mixed hyperlipidemia: Secondary | ICD-10-CM

## 2021-08-29 NOTE — Assessment & Plan Note (Signed)
Duplex today shows stable 1 to 39% ICA stenosis bilaterally without significant progression from previous studies.  The tinnitus and dizziness are unlikely related to carotid disease at this point.  Continue aspirin therapy.  Continue to check annually.

## 2021-08-29 NOTE — Progress Notes (Signed)
MRN : [id number]  Lori Kidd is a 71 y.o. (April 04, 1950) female who presents with chief complaint of No chief complaint on file. Marland Kitchen  History of Present Illness: Patient returns in follow-up of her carotid disease.  She still having a lot of tinnitus and dizziness and has been taking medications for this most days but not all days.  She denies any focal neurologic symptoms. Specifically, the patient denies amaurosis fugax, speech or swallowing difficulties, or arm or leg weakness or numbness. Duplex today shows stable 1 to 39% ICA stenosis bilaterally without significant progression from previous studies.  Current Outpatient Medications  Medication Sig Dispense Refill   Acetaminophen 500 MG capsule Take by mouth every 6 (six) hours as needed for fever.     albuterol (PROVENTIL HFA;VENTOLIN HFA) 108 (90 Base) MCG/ACT inhaler Inhale 2 puffs into the lungs every 6 (six) hours as needed for wheezing or shortness of breath.     aspirin EC 81 MG tablet Take 81 mg by mouth daily. Swallow whole.     Cholecalciferol 25 MCG (1000 UT) capsule Take by mouth.     clindamycin (CLEOCIN) 300 MG capsule TAKE 2 CAPSULES BY MOUTH 1 HOUR PRIOR TO DENTAL APPOINTMENT     EPINEPHrine 0.3 mg/0.3 mL IJ SOAJ injection Inject 0.3 mg into the muscle once.     fluticasone (FLONASE) 50 MCG/ACT nasal spray Place 1 spray into both nostrils daily.     Fluticasone Propionate, Inhal, 250 MCG/BLIST AEPB Inhale 1 puff into the lungs daily.     levothyroxine (SYNTHROID, LEVOTHROID) 112 MCG tablet Take 112 mcg by mouth daily before breakfast.      loratadine (CLARITIN) 10 MG tablet Take 10 mg by mouth daily as needed for allergies.     naproxen sodium (ALEVE) 220 MG tablet Take 220 mg by mouth daily as needed.     Omega-3 1000 MG CAPS Take 2,000 mg by mouth daily.      pravastatin (PRAVACHOL) 40 MG tablet Take 40 mg by mouth daily.      tretinoin (RETIN-A) 0.025 % cream SMARTSIG:sparingly Topical Every Evening      triamterene-hydrochlorothiazide (MAXZIDE-25) 37.5-25 MG tablet Take 1 tablet by mouth daily.     enoxaparin (LOVENOX) 40 MG/0.4ML injection Inject 0.4 mLs (40 mg total) into the skin daily for 14 days. 14 Syringe 0   No current facility-administered medications for this visit.    Past Medical History:  Diagnosis Date   Arthritis    Asthma    High blood pressure    High cholesterol    Hypothyroidism    Meniere disease    Pneumonia     Past Surgical History:  Procedure Laterality Date   APPENDECTOMY  1982   CATARACT EXTRACTION W/PHACO Right 03/17/2021   Procedure: CATARACT EXTRACTION PHACO AND INTRAOCULAR LENS PLACEMENT (IOC) RIGHT;  Surgeon: Nevada Crane, MD;  Location: [facility];  Service: Ophthalmology;  Laterality: Right;  4.96 00:35.7   COLONOSCOPY     Q5YRS SINCE 1998   COLONOSCOPY WITH PROPOFOL N/A 01/29/2021   Procedure: COLONOSCOPY WITH PROPOFOL;  Surgeon: Toledo, Boykin Nearing, MD;  Location: [facility];  Service: Gastroenterology;  Laterality: N/A;   KNEE ARTHROPLASTY Right 01/24/2018   Procedure: COMPUTER ASSISTED TOTAL KNEE ARTHROPLASTY;  Surgeon: Donato Heinz, MD;  Location: [facility];  Service: Orthopedics;  Laterality: Right;   SIGMOID RESECTION / RECTOPEXY  1998   TONSILLECTOMY  1962   TUBAL LIGATION  1983   VAGINAL HYSTERECTOMY  2000     Social History   Tobacco Use   Smoking status: Former    Types: Cigarettes   Smokeless tobacco: Never  Vaping Use   Vaping Use: Never used  Substance Use Topics   Alcohol use: Yes    Alcohol/week: 3.0 standard drinks of alcohol    Types: 3 Cans of beer per week    Comment: NONE LAST 24HRS   Drug use: Never      Family History  Problem Relation Age of Onset   Breast cancer Sister        early 69's   Pneumonia Mother    Pneumonia Father      Allergies  Allergen Reactions   Ace Inhibitors Cough   Penicillins Rash and Other (See Comments)    Redness Has patient had a PCN reaction causing  immediate rash, facial/tongue/throat swelling, SOB or lightheadedness with hypotension: Yes Has patient had a PCN reaction causing severe rash involving mucus membranes or skin necrosis: No Has patient had a PCN reaction that required hospitalization: No Has patient had a PCN reaction occurring within the last 10 years: No If all of the above answers are "NO", then may proceed with Cephalosporin use.       REVIEW OF SYSTEMS (Negative unless checked)   Constitutional: [] Weight loss  [] Fever  [] Chills Cardiac: [] Chest pain   [] Chest pressure   [] Palpitations   [] Shortness of breath when laying flat   [] Shortness of breath at rest   [] Shortness of breath with exertion. Vascular:  [] Pain in legs with walking   [] Pain in legs at rest   [] Pain in legs when laying flat   [] Claudication   [] Pain in feet when walking  [] Pain in feet at rest  [] Pain in feet when laying flat   [] History of DVT   [] Phlebitis   [] Swelling in legs   [] Varicose veins   [] Non-healing ulcers Pulmonary:   [] Uses home oxygen   [] Productive cough   [] Hemoptysis   [] Wheeze  [] COPD   [] Asthma Neurologic:  [x] Dizziness  [] Blackouts   [] Seizures   [] History of stroke   [] History of TIA  [] Aphasia   [] Temporary blindness   [] Dysphagia   [] Weakness or numbness in arms   [] Weakness or numbness in legs Musculoskeletal:  [x] Arthritis   [] Joint swelling   [] Joint pain   [] Low back pain Hematologic:  [] Easy bruising  [] Easy bleeding   [] Hypercoagulable state   [] Anemic  [] Hepatitis Gastrointestinal:  [] Blood in stool   [] Vomiting blood  [] Gastroesophageal reflux/heartburn   [x] Abdominal pain Genitourinary:  [] Chronic kidney disease   [] Difficult urination  [] Frequent urination  [] Burning with urination   [] Hematuria Skin:  [] Rashes   [] Ulcers   [] Wounds Psychological:  [] History of anxiety   []  History of major depression.  Physical Examination  Vitals:   08/29/21 1132  BP: 136/76  Pulse: 66  Resp: 17  Weight: 147 lb (66.7 kg)   Height: 5\' 3"  (1.6 m)   Body mass index is 26.04 kg/m. Gen:  WD/WN, NAD.  Appears younger than stated age Head: [place]/AT, No temporalis wasting. Ear/Nose/Throat: Hearing grossly intact, nares w/o erythema or drainage, trachea midline Eyes: Conjunctiva clear. Sclera non-icteric Neck: Supple.  No bruit  Pulmonary:  Good air movement, equal and clear to auscultation bilaterally.  Cardiac: RRR, No JVD Vascular:  Vessel Right Left  Radial Palpable Palpable       Musculoskeletal: M/S 5/5 throughout.  No deformity or atrophy.  No edema. Neurologic: CN 2-12 intact.  Sensation grossly intact in extremities.  Symmetrical.  Speech is fluent. Motor exam as listed above. Psychiatric: Judgment intact, Mood & affect appropriate for pt's clinical situation. Dermatologic: No rashes or ulcers noted.  No cellulitis or open wounds.     CBC Lab Results  Component Value Date   WBC 10.8 (H) 01/26/2018   HGB 10.0 (L) 01/26/2018   HCT 30.2 (L) 01/26/2018   MCV 90.7 01/26/2018   PLT 206 01/26/2018    BMET    Component Value Date/Time   NA 139 01/12/2018 0855   NA 139 10/27/2012 1816   K 3.7 01/12/2018 0855   K 3.6 10/27/2012 1816   CL 103 01/12/2018 0855   CL 107 10/27/2012 1816   CO2 29 01/12/2018 0855   CO2 22 10/27/2012 1816   GLUCOSE 90 01/12/2018 0855   GLUCOSE 99 10/27/2012 1816   BUN 25 (H) 01/12/2018 0855   BUN 20 (H) 10/27/2012 1816   CREATININE 0.81 01/12/2018 0855   CREATININE 0.70 10/27/2012 1816   CALCIUM 9.6 01/12/2018 0855   CALCIUM 9.3 10/27/2012 1816   GFRNONAA >60 01/12/2018 0855   GFRNONAA >60 10/27/2012 1816   GFRAA >60 01/12/2018 0855   GFRAA >60 10/27/2012 1816   CrCl cannot be calculated (Patient's most recent lab result is older than the maximum 21 days allowed.).  COAG Lab Results  Component Value Date   INR 0.90 01/12/2018   INR 0.9 10/27/2012    Radiology No results found.   Assessment/Plan Essential hypertension blood pressure control important  in reducing the progression of atherosclerotic disease. On appropriate oral medications.     Mixed hyperlipidemia lipid control important in reducing the progression of atherosclerotic disease. Continue statin therapy  Carotid stenosis Duplex today shows stable 1 to 39% ICA stenosis bilaterally without significant progression from previous studies.  The tinnitus and dizziness are unlikely related to carotid disease at this point.  Continue aspirin therapy.  Continue to check annually.    Leotis Pain, MD  08/29/2021 12:14 PM    This note was created with Dragon medical transcription system.  Any errors from dictation are purely unintentional

## 2021-09-01 ENCOUNTER — Ambulatory Visit
Admission: RE | Admit: 2021-09-01 | Discharge: 2021-09-01 | Disposition: A | Discharge status: Home / Self Care | Payer: Medicare PPO | Source: Ambulatory Visit | Attending: Family Medicine | Admitting: Family Medicine

## 2021-09-01 DIAGNOSIS — Z1231 Encounter for screening mammogram for malignant neoplasm of breast: Secondary | ICD-10-CM | POA: Diagnosis present

## 2021-11-24 ENCOUNTER — Encounter (INDEPENDENT_AMBULATORY_CARE_PROVIDER_SITE_OTHER): Payer: Self-pay

## 2022-08-03 ENCOUNTER — Other Ambulatory Visit: Payer: Self-pay | Admitting: Family Medicine

## 2022-08-03 DIAGNOSIS — Z1231 Encounter for screening mammogram for malignant neoplasm of breast: Secondary | ICD-10-CM

## 2022-09-04 ENCOUNTER — Ambulatory Visit
Admission: RE | Admit: 2022-09-04 | Discharge: 2022-09-04 | Disposition: A | Discharge status: Home / Self Care | Payer: Medicare PPO | Source: Ambulatory Visit | Attending: Family Medicine | Admitting: Family Medicine

## 2022-09-04 DIAGNOSIS — Z1231 Encounter for screening mammogram for malignant neoplasm of breast: Secondary | ICD-10-CM | POA: Diagnosis present

## 2022-09-07 ENCOUNTER — Other Ambulatory Visit (INDEPENDENT_AMBULATORY_CARE_PROVIDER_SITE_OTHER): Payer: Self-pay | Admitting: Vascular Surgery

## 2022-09-07 DIAGNOSIS — I6523 Occlusion and stenosis of bilateral carotid arteries: Secondary | ICD-10-CM

## 2022-09-08 ENCOUNTER — Ambulatory Visit (INDEPENDENT_AMBULATORY_CARE_PROVIDER_SITE_OTHER): Payer: Medicare PPO | Admitting: Vascular Surgery

## 2022-09-08 ENCOUNTER — Ambulatory Visit (INDEPENDENT_AMBULATORY_CARE_PROVIDER_SITE_OTHER): Payer: Medicare PPO

## 2022-09-08 ENCOUNTER — Encounter (INDEPENDENT_AMBULATORY_CARE_PROVIDER_SITE_OTHER): Payer: Self-pay | Admitting: Vascular Surgery

## 2022-09-08 VITALS — BP 115/80 | HR 89 | Resp 18 | Ht 63.0 in | Wt 147.6 lb

## 2022-09-08 DIAGNOSIS — I6523 Occlusion and stenosis of bilateral carotid arteries: Secondary | ICD-10-CM

## 2022-09-08 DIAGNOSIS — E782 Mixed hyperlipidemia: Secondary | ICD-10-CM | POA: Diagnosis not present

## 2022-09-08 DIAGNOSIS — I10 Essential (primary) hypertension: Secondary | ICD-10-CM | POA: Diagnosis not present

## 2022-09-08 DIAGNOSIS — I6521 Occlusion and stenosis of right carotid artery: Secondary | ICD-10-CM

## 2022-09-08 NOTE — Assessment & Plan Note (Signed)
Carotid duplex today reveals stable very mild carotid stenosis in the 1 to 39% range bilaterally.  Both vertebral arteries are antegrade.  No role for intervention.  On aspirin and a statin agent.  Recheck in 1 year.

## 2022-09-08 NOTE — Progress Notes (Signed)
MRN : [id number]  Lori Kidd is a 72 y.o. (10-11-1950) female who presents with chief complaint of  Chief Complaint  Patient presents with   Follow-up    f./u in 1 year with carotid  .  History of Present Illness: Patient returns in follow-up of her carotid disease and dizziness.  Her dizziness has gotten much better and has been treated with positional techniques.  No focal neurologic symptoms. Specifically, the patient denies amaurosis fugax, speech or swallowing difficulties, or arm or leg weakness or numbness. Carotid duplex today reveals stable very mild carotid stenosis in the 1 to 39% range bilaterally.  Both vertebral arteries are antegrade.    Current Outpatient Medications  Medication Sig Dispense Refill   Acetaminophen 500 MG capsule Take by mouth every 6 (six) hours as needed for fever.     albuterol (PROVENTIL HFA;VENTOLIN HFA) 108 (90 Base) MCG/ACT inhaler Inhale 2 puffs into the lungs every 6 (six) hours as needed for wheezing or shortness of breath.     aspirin EC 81 MG tablet Take 81 mg by mouth daily. Swallow whole.     Cholecalciferol 25 MCG (1000 UT) capsule Take by mouth.     cyanocobalamin (VITAMIN B12) 1000 MCG tablet Take 1,000 mcg by mouth daily.     EPINEPHrine 0.3 mg/0.3 mL IJ SOAJ injection Inject 0.3 mg into the muscle once.     fluticasone (FLONASE) 50 MCG/ACT nasal spray Place 1 spray into both nostrils daily.     Fluticasone Propionate, Inhal, 250 MCG/BLIST AEPB Inhale 1 puff into the lungs daily.     levothyroxine (SYNTHROID, LEVOTHROID) 112 MCG tablet Take 112 mcg by mouth daily before breakfast.      loratadine (CLARITIN) 10 MG tablet Take 10 mg by mouth daily as needed for allergies.     naproxen sodium (ALEVE) 220 MG tablet Take 220 mg by mouth daily as needed.     Omega-3 1000 MG CAPS Take 2,000 mg by mouth daily.      pravastatin (PRAVACHOL) 40 MG tablet Take 40 mg by mouth daily.      tretinoin (RETIN-A) 0.025 % cream  SMARTSIG:sparingly Topical Every Evening     triamterene-hydrochlorothiazide (MAXZIDE-25) 37.5-25 MG tablet Take 1 tablet by mouth daily.     ARNUITY ELLIPTA 100 MCG/ACT AEPB Inhale into the lungs. (Patient not taking: Reported on 09/08/2022)     clindamycin (CLEOCIN) 300 MG capsule TAKE 2 CAPSULES BY MOUTH 1 HOUR PRIOR TO DENTAL APPOINTMENT (Patient not taking: Reported on 09/08/2022)     No current facility-administered medications for this visit.    Past Medical History:  Diagnosis Date   Arthritis    Asthma    High blood pressure    High cholesterol    Hypothyroidism    Meniere disease    Pneumonia     Past Surgical History:  Procedure Laterality Date   APPENDECTOMY  1982   CATARACT EXTRACTION W/PHACO Right 03/17/2021   Procedure: CATARACT EXTRACTION PHACO AND INTRAOCULAR LENS PLACEMENT (IOC) RIGHT;  Surgeon: Nevada Crane, MD;  Location: [facility];  Service: Ophthalmology;  Laterality: Right;  4.96 00:35.7   COLONOSCOPY     Q5YRS SINCE 1998   COLONOSCOPY WITH PROPOFOL N/A 01/29/2021   Procedure: COLONOSCOPY WITH PROPOFOL;  Surgeon: Toledo, Boykin Nearing, MD;  Location: [facility];  Service: Gastroenterology;  Laterality: N/A;   KNEE ARTHROPLASTY Right 01/24/2018   Procedure: COMPUTER ASSISTED TOTAL KNEE ARTHROPLASTY;  Surgeon: Donato Heinz, MD;  Location:  [facility];  Service: Orthopedics;  Laterality: Right;   SIGMOID RESECTION / RECTOPEXY  1998   TONSILLECTOMY  1962   TUBAL LIGATION  1983   VAGINAL HYSTERECTOMY  2000     Social History   Tobacco Use   Smoking status: Former    Types: Cigarettes   Smokeless tobacco: Never  Vaping Use   Vaping status: Never Used  Substance Use Topics   Alcohol use: Yes    Alcohol/week: 3.0 standard drinks of alcohol    Types: 3 Cans of beer per week    Comment: NONE LAST 24HRS   Drug use: Never      Family History  Problem Relation Age of Onset   Breast cancer Sister        early 110's   Pneumonia Mother     Pneumonia Father      Allergies  Allergen Reactions   Ace Inhibitors Cough   Penicillins Rash and Other (See Comments)    Redness Has patient had a PCN reaction causing immediate rash, facial/tongue/throat swelling, SOB or lightheadedness with hypotension: Yes Has patient had a PCN reaction causing severe rash involving mucus membranes or skin necrosis: No Has patient had a PCN reaction that required hospitalization: No Has patient had a PCN reaction occurring within the last 10 years: No If all of the above answers are "NO", then may proceed with Cephalosporin use.      REVIEW OF SYSTEMS (Negative unless checked)  Constitutional: [] Weight loss  [] Fever  [] Chills Cardiac: [] Chest pain   [] Chest pressure   [] Palpitations   [] Shortness of breath when laying flat   [] Shortness of breath at rest   [] Shortness of breath with exertion. Vascular:  [] Pain in legs with walking   [] Pain in legs at rest   [] Pain in legs when laying flat   [] Claudication   [] Pain in feet when walking  [] Pain in feet at rest  [] Pain in feet when laying flat   [] History of DVT   [] Phlebitis   [] Swelling in legs   [] Varicose veins   [] Non-healing ulcers Pulmonary:   [] Uses home oxygen   [] Productive cough   [] Hemoptysis   [] Wheeze  [] COPD   [] Asthma Neurologic:  [x] Dizziness  [] Blackouts   [] Seizures   [] History of stroke   [] History of TIA  [] Aphasia   [] Temporary blindness   [] Dysphagia   [] Weakness or numbness in arms   [] Weakness or numbness in legs Musculoskeletal:  [x] Arthritis   [] Joint swelling   [] Joint pain   [] Low back pain Hematologic:  [] Easy bruising  [] Easy bleeding   [] Hypercoagulable state   [x] Anemic  [] Hepatitis Gastrointestinal:  [] Blood in stool   [] Vomiting blood  [] Gastroesophageal reflux/heartburn   [] Difficulty swallowing. Genitourinary:  [] Chronic kidney disease   [] Difficult urination  [] Frequent urination  [] Burning with urination   [] Blood in urine Skin:  [] Rashes   [] Ulcers    [] Wounds Psychological:  [] History of anxiety   []  History of major depression.  Physical Examination  Vitals:   09/08/22 1031  BP: 115/80  Pulse: 89  Resp: 18  Weight: 147 lb 9.6 oz (67 kg)  Height: 5\' 3"  (1.6 m)   Body mass index is 26.15 kg/m. Gen:  WD/WN, NAD. Appears younger than stated age. Head: [place]/AT, No temporalis wasting. Ear/Nose/Throat: Hearing grossly intact, nares w/o erythema or drainage, trachea midline Eyes: Conjunctiva clear. Sclera non-icteric Neck: Supple.  No bruit  Pulmonary:  Good air movement, equal and clear to auscultation bilaterally.  Cardiac: RRR,  No JVD Vascular:  Vessel Right Left  Radial Palpable Palpable           Musculoskeletal: M/S 5/5 throughout.  No deformity or atrophy. No edema. Neurologic: CN 2-12 intact. Sensation grossly intact in extremities.  Symmetrical.  Speech is fluent. Motor exam as listed above. Psychiatric: Judgment intact, Mood & affect appropriate for pt's clinical situation. Dermatologic: No rashes or ulcers noted.  No cellulitis or open wounds.    CBC Lab Results  Component Value Date   WBC 10.8 (H) 01/26/2018   HGB 10.0 (L) 01/26/2018   HCT 30.2 (L) 01/26/2018   MCV 90.7 01/26/2018   PLT 206 01/26/2018    BMET    Component Value Date/Time   NA 139 01/12/2018 0855   NA 139 10/27/2012 1816   K 3.7 01/12/2018 0855   K 3.6 10/27/2012 1816   CL 103 01/12/2018 0855   CL 107 10/27/2012 1816   CO2 29 01/12/2018 0855   CO2 22 10/27/2012 1816   GLUCOSE 90 01/12/2018 0855   GLUCOSE 99 10/27/2012 1816   BUN 25 (H) 01/12/2018 0855   BUN 20 (H) 10/27/2012 1816   CREATININE 0.81 01/12/2018 0855   CREATININE 0.70 10/27/2012 1816   CALCIUM 9.6 01/12/2018 0855   CALCIUM 9.3 10/27/2012 1816   GFRNONAA >60 01/12/2018 0855   GFRNONAA >60 10/27/2012 1816   GFRAA >60 01/12/2018 0855   GFRAA >60 10/27/2012 1816   CrCl cannot be calculated (Patient's most recent lab result is older than the maximum 21 days  allowed.).  COAG Lab Results  Component Value Date   INR 0.90 01/12/2018   INR 0.9 10/27/2012    Radiology MM 3D SCREENING MAMMOGRAM BILATERAL BREAST  Result Date: 09/07/2022 CLINICAL DATA:  Screening. EXAM: DIGITAL SCREENING BILATERAL MAMMOGRAM WITH TOMOSYNTHESIS AND CAD TECHNIQUE: Bilateral screening digital craniocaudal and mediolateral oblique mammograms were obtained. Bilateral screening digital breast tomosynthesis was performed. The images were evaluated with computer-aided detection. COMPARISON:  Previous exam(s). ACR Breast Density Category b: There are scattered areas of fibroglandular density. FINDINGS: In the right breast, calcifications warrant further evaluation with magnified views. In the left breast, no findings suspicious for malignancy. IMPRESSION: Further evaluation is suggested for calcifications in the right breast. RECOMMENDATION: Diagnostic mammogram of the right breast. (Code:[id number]) The patient will be contacted regarding the findings, and additional imaging will be scheduled. BI-RADS CATEGORY  0: Incomplete: Need additional imaging evaluation. Electronically Signed   By: Sande Brothers M.D.   On: 09/07/2022 13:26     Assessment/Plan Carotid stenosis Carotid duplex today reveals stable very mild carotid stenosis in the 1 to 39% range bilaterally.  Both vertebral arteries are antegrade.  No role for intervention.  On aspirin and a statin agent.  Recheck in 1 year.  Essential hypertension blood pressure control important in reducing the progression of atherosclerotic disease. On appropriate oral medications.     Mixed hyperlipidemia lipid control important in reducing the progression of atherosclerotic disease. Continue statin therapy  Festus Barren, MD  09/08/2022 11:17 AM    This note was created with Dragon medical transcription system.  Any errors from dictation are purely unintentional

## 2022-09-09 ENCOUNTER — Other Ambulatory Visit: Payer: Self-pay | Admitting: Family Medicine

## 2022-09-09 DIAGNOSIS — R921 Mammographic calcification found on diagnostic imaging of breast: Secondary | ICD-10-CM

## 2022-09-09 DIAGNOSIS — R928 Other abnormal and inconclusive findings on diagnostic imaging of breast: Secondary | ICD-10-CM

## 2022-09-11 ENCOUNTER — Ambulatory Visit
Admission: RE | Admit: 2022-09-11 | Discharge: 2022-09-11 | Disposition: A | Discharge status: Home / Self Care | Payer: Medicare PPO | Source: Ambulatory Visit | Attending: Family Medicine | Admitting: Family Medicine

## 2022-09-11 DIAGNOSIS — R921 Mammographic calcification found on diagnostic imaging of breast: Secondary | ICD-10-CM | POA: Diagnosis present

## 2022-09-11 DIAGNOSIS — R928 Other abnormal and inconclusive findings on diagnostic imaging of breast: Secondary | ICD-10-CM | POA: Diagnosis not present

## 2022-09-17 ENCOUNTER — Other Ambulatory Visit: Payer: Self-pay | Admitting: Family Medicine

## 2022-09-17 DIAGNOSIS — R928 Other abnormal and inconclusive findings on diagnostic imaging of breast: Secondary | ICD-10-CM

## 2022-09-17 DIAGNOSIS — R921 Mammographic calcification found on diagnostic imaging of breast: Secondary | ICD-10-CM

## 2022-09-22 ENCOUNTER — Ambulatory Visit
Admission: RE | Admit: 2022-09-22 | Discharge: 2022-09-22 | Disposition: A | Discharge status: Home / Self Care | Payer: Medicare PPO | Source: Ambulatory Visit | Attending: Family Medicine | Admitting: Family Medicine

## 2022-09-22 ENCOUNTER — Ambulatory Visit: Admission: RE | Admit: 2022-09-22 | Payer: Medicare PPO | Source: Ambulatory Visit

## 2022-09-22 DIAGNOSIS — R928 Other abnormal and inconclusive findings on diagnostic imaging of breast: Secondary | ICD-10-CM

## 2022-09-22 DIAGNOSIS — R921 Mammographic calcification found on diagnostic imaging of breast: Secondary | ICD-10-CM

## 2022-09-22 HISTORY — PX: BREAST BIOPSY: SHX20

## 2022-09-22 MED ORDER — LIDOCAINE-EPINEPHRINE 1 %-1:100000 IJ SOLN
20.0000 mL | Freq: Once | INTRAMUSCULAR | Status: AC
  Administered 2022-09-22: 20 mL

## 2022-09-22 MED ORDER — LIDOCAINE 1 % OPTIME INJ - NO CHARGE
5.0000 mL | Freq: Once | INTRAMUSCULAR | Status: AC
Start: 2022-09-22 — End: 2022-09-22
  Administered 2022-09-22: 5 mL
  Filled 2022-09-22: qty 6

## 2022-09-23 ENCOUNTER — Encounter: Payer: Self-pay | Admitting: *Deleted

## 2022-09-23 DIAGNOSIS — D0511 Intraductal carcinoma in situ of right breast: Secondary | ICD-10-CM

## 2022-09-23 NOTE — Progress Notes (Signed)
Received referral for newly diagnosed breast cancer from [facility].  Navigation initiated.  She will see Dr. Smith Robert on 9/3 at 10:45 and will see Dr. Maia Plan on 9/3 at 2:15.

## 2022-09-29 ENCOUNTER — Inpatient Hospital Stay: Payer: Medicare PPO

## 2022-09-29 ENCOUNTER — Encounter: Payer: Self-pay | Admitting: Oncology

## 2022-09-29 ENCOUNTER — Encounter: Payer: Self-pay | Admitting: *Deleted

## 2022-09-29 ENCOUNTER — Other Ambulatory Visit: Payer: Self-pay | Admitting: General Surgery

## 2022-09-29 ENCOUNTER — Ambulatory Visit: Payer: Self-pay | Admitting: General Surgery

## 2022-09-29 ENCOUNTER — Inpatient Hospital Stay: Payer: Medicare PPO | Attending: Oncology | Admitting: Oncology

## 2022-09-29 VITALS — BP 120/81 | HR 81 | Temp 98.6°F | Resp 18 | Ht 63.0 in | Wt 148.3 lb

## 2022-09-29 DIAGNOSIS — Z803 Family history of malignant neoplasm of breast: Secondary | ICD-10-CM | POA: Diagnosis not present

## 2022-09-29 DIAGNOSIS — Z87891 Personal history of nicotine dependence: Secondary | ICD-10-CM | POA: Diagnosis not present

## 2022-09-29 DIAGNOSIS — C50911 Malignant neoplasm of unspecified site of right female breast: Secondary | ICD-10-CM

## 2022-09-29 DIAGNOSIS — Z79899 Other long term (current) drug therapy: Secondary | ICD-10-CM | POA: Insufficient documentation

## 2022-09-29 DIAGNOSIS — D0511 Intraductal carcinoma in situ of right breast: Secondary | ICD-10-CM | POA: Diagnosis not present

## 2022-09-29 NOTE — Progress Notes (Signed)
Accompanied patient and family to initial medical oncology appointment.   Reviewed Breast Cancer treatment handbook.   Care plan summary given to patient.   Reviewed outreach programs and cancer center services.   

## 2022-09-29 NOTE — H&P (View-Only) (Signed)
PATIENT PROFILE: Lori Kidd is a 72 y.o. female who presents to the Clinic for consultation at the request of Dr. Burnadette Kidd for evaluation of ductal carcinoma in situ of right breast.  PCP:  Lori Ivan, MD  HISTORY OF PRESENT ILLNESS: Ms. Lori Kidd reports she had her usual screening mammogram on 09/04/2022.  She was found with small area of concern in calcification of the right breast.  Diagnostic mammogram confirmed a 0.9 cm group of pleomorphic calcification on the outer right breast.  Stereotactic core biopsy done on 09/22/2022 showed ductal carcinoma in situ, intermediate grade with comedonecrosis.  Patient denies any previous nipple discharge, breast pain, skin changes, palpable masses.  Family history of breast cancer: Sister Family history of other cancers: None Previous breast biopsies: None  PROBLEM LIST: Problem List  Date Reviewed: 04/22/2022          Noted   Meniere disease, bilateral 10/11/2020   Medicare annual wellness visit, subsequent 04/22/22 04/08/2020   Medicare annual wellness visit, initial 04/05/19 04/05/2019   Status post total right knee replacement 03/13/2018   Mild intermittent asthma without complication (HHS-HCC) 11/15/2013   Essential hypertension Unknown   Acquired hypothyroidism (TSH 1 - 04/15/22) Unknown   Mixed hyperlipidemia (LDL 88 - 04/15/22) Unknown    GENERAL REVIEW OF SYSTEMS:   General ROS: negative for - chills, fatigue, fever, weight gain or weight loss Allergy and Immunology ROS: negative for - hives  Hematological and Lymphatic ROS: negative for - bleeding problems or bruising, negative for palpable nodes Endocrine ROS: negative for - heat or cold intolerance, hair changes Respiratory ROS: negative for - cough, shortness of breath or wheezing Cardiovascular ROS: no chest pain or palpitations GI ROS: negative for nausea, vomiting, abdominal pain, diarrhea, constipation Musculoskeletal ROS: negative for - joint swelling or muscle  pain Neurological ROS: negative for - confusion, syncope Dermatological ROS: negative for pruritus and rash Psychiatric: negative for anxiety, depression, difficulty sleeping and memory loss  MEDICATIONS: Current Outpatient Medications  Medication Sig Dispense Refill   acetaminophen (TYLENOL) 650 MG ER tablet Take 650 mg by mouth every 8 (eight) hours as needed       albuterol 90 mcg/actuation inhaler Inhale 2 inhalations into the lungs every 4 (four) hours as needed for Wheezing 8.5 each 1   aspirin 81 MG EC tablet Take 81 mg by mouth once daily.     cholecalciferol (VITAMIN D3) 1000 unit capsule Take 1,000 Units by mouth once daily     clindamycin (CLEOCIN) 300 MG capsule TAKE 2 CAPSULES BY MOUTH 1 HOUR PRIOR TO DENTAL APPOINTMENT     cyanocobalamin (VITAMIN B12) 1000 MCG tablet Take 1,000 mcg by mouth once daily     fluticasone propionate (FLONASE) 50 mcg/actuation nasal spray SPRAY 2 SPRAYS INTO EACH NOSTRIL EVERY DAY 48 g 1   levothyroxine (SYNTHROID) 100 MCG tablet TAKE 1 TABLET ONCE DAILY ON AN EMPTY STOMACH WITH A GLASS OF WATER AT LEAST 30-60 MINUTES BEFORE BREAKFAST 90 tablet 3   loratadine (CLARITIN) 10 mg capsule Take 10 mg by mouth once daily        omega 3-dha-epa-fish oil 1,000 mg (120 mg-180 mg) Cap Take 2 capsules by mouth once daily.       pravastatin (PRAVACHOL) 40 MG tablet take 1 tablet at bedtime 90 tablet 3   tretinoin (RETIN-A) 0.025 % cream SMARTSIG:sparingly Topical Every Evening     triamterene-hydroCHLOROthiazide (MAXZIDE-25) 37.5-25 mg tablet take 1 tablet every day 90 tablet 3   fluticasone furoate (ARNUITY  ELLIPTA) 100 mcg/actuation DsDv Inhale 1 Puff (1 spray total) into the lungs once daily (Patient not taking: Reported on 09/29/2022) 30 each 3   No current facility-administered medications for this visit.    ALLERGIES: Ace inhibitors and Penicillins  PAST MEDICAL HISTORY: Past Medical History:  Diagnosis Date   Allergic rhinitis    Allergy     Seasonal   Arthritis    Asthma without status asthmaticus (HHS-HCC) 2005   Well managed   Breast cancer (CMS/HHS-HCC) 09/2022   Encounter for blood transfusion 2003   Hyperlipidemia    Hypertension    Hypothyroidism     PAST SURGICAL HISTORY: Past Surgical History:  Procedure Laterality Date   APPENDECTOMY  1982   TUBAL LIGATION  1983   COLON SURGERY  1998   Sigmoid Resection   COLONOSCOPY  01/02/1997   Diverticulitis of the sigmoid colon w/71mm Adenomatous Polyp; Sigmoid Colectomy   HYSTERECTOMY  06/2001   COLONOSCOPY  10/05/2003   PH Adenomatous Polyp   COLONOSCOPY  08/01/2009   PH Adenomatous Polyp: CBF 07/2014; Recall Ltr mailed 06/07/2014 (dw)   COLONOSCOPY N/A 01/02/2015   Dr. Maggie Font @ [facility] - PHPolyp: CBF 12/2019   Right total knee arthroplasty using computer-assisted navigation  01/24/2018   Dr Ernest Pine   COLONOSCOPY  01/29/2021   Internal hemorrhoids/Diverticulosis/Otherwise normal/PHx CP/Repeat 30yrs/TKT   Sigmoid resection in '98 for diverticulitis     TONSILLECTOMY AND ADENOIDECTOMY       FAMILY HISTORY: Family History  Problem Relation Name Age of Onset   Myocardial Infarction (Heart attack) Mother [place]    Coronary Artery Disease (Blocked arteries around heart) Mother [place]    Hyperlipidemia (Elevated cholesterol) Mother [place]    High blood pressure (Hypertension) Mother [place]    Osteoarthritis Mother [place]    Breast cancer Sister     No Known Problems Brother     Colon polyps Sister TammyJenkins    High blood pressure (Hypertension) Sister TammyJenkins    Osteoarthritis Sister TammyJenkins    Thyroid disease Sister TammyJenkins    Breast cancer Sister Celesta Gentile        2012   Thyroid disease Sister Celesta Gentile    Colon polyps Father Titus Dubin    Hyperlipidemia (Elevated cholesterol) Father Titus Dubin    Obesity Father Titus Dubin    Osteoarthritis Father Titus Dubin    Osteoarthritis  Brother Marina Goodell    Thyroid disease Sister Cecille [place]      SOCIAL HISTORY: Social History   Socioeconomic History   Marital status: Married    Spouse name: Casimiro Needle   Number of children: 2   Years of education: 16  Occupational History   Occupation: Retired Runner, broadcasting/film/video  Tobacco Use   Smoking status: Former    Current packs/day: 0.00    Average packs/day: 0.1 packs/day for 4.0 years (0.4 ttl pk-yrs)    Types: Cigarettes    Start date: 01/27/1971    Quit date: 01/27/1975    Years since quitting: 47.7    Passive exposure: Past   Smokeless tobacco: Never   Tobacco comments:    Smoked 10 cigarettes weekly- smoked x 4-5 years  Vaping Use   Vaping status: Never Used  Substance and Sexual Activity   Alcohol use: Yes    Alcohol/week: 2.0 standard drinks of alcohol    Types: 2 Standard drinks or equivalent per week    Comment: Occasional   Drug use: No   Sexual activity: Yes  Partners: Male  Social History Narrative   Marital status- Married   Lives with husband   Employment- Research scientist (life sciences))   Supplements- Glucosamine & Chronditin   Exercise hx- None   Religious affliation- Christian   Social Determinants of Health   Financial Resource Strain: Low Risk  (04/22/2022)   Overall Financial Resource Strain (CARDIA)    Difficulty of Paying Living Expenses: Not hard at all  Food Insecurity: No Food Insecurity (09/29/2022)   Received from [facility]   Hunger Vital Sign    Worried About Running Out of Food in the Last Year: Never true    Ran Out of Food in the Last Year: Never true  Transportation Needs: No Transportation Needs (04/22/2022)   PRAPARE - Administrator, Civil Service (Medical): No    Lack of Transportation (Non-Medical): No    PHYSICAL EXAM: Vitals:   09/29/22 1402  BP: 130/85  Pulse: 96   Body mass index is 26.57 kg/m. Weight: 68 kg (150 lb)   GENERAL: Alert, active, oriented x3  HEENT: Pupils equal reactive to light.  Extraocular movements are intact. Sclera clear. Palpebral conjunctiva normal red color.Pharynx clear.  NECK: Supple with no palpable mass and no adenopathy.  LUNGS: Sound clear with no rales rhonchi or wheezes.  HEART: Regular rhythm S1 and S2 without murmur.  BREAST: breasts appear normal, no suspicious masses, no skin or nipple changes or axillary nodes.  ABDOMEN: Soft and depressible, nontender with no palpable mass, no hepatomegaly.  EXTREMITIES: Well-developed well-nourished symmetrical with no dependent edema.  NEUROLOGICAL: Awake alert oriented, facial expression symmetrical, moving all extremities.  REVIEW OF DATA: I have reviewed the following data today: No visits with results within 3 Month(s) from this visit.  Latest known visit with results is:  Ancillary Orders on 04/15/2022  Component Date Value   Glucose 04/15/2022 85    Sodium 04/15/2022 142    Potassium 04/15/2022 3.6    Chloride 04/15/2022 104    Carbon Dioxide (CO2) 04/15/2022 28.4    Urea Nitrogen (BUN) 04/15/2022 16    Creatinine 04/15/2022 0.7    Glomerular Filtration Ra* 04/15/2022 94    Calcium 04/15/2022 9.4    AST  04/15/2022 17    ALT  04/15/2022 12    Alk Phos (alkaline Phosp* 04/15/2022 84    Albumin 04/15/2022 4.4    Bilirubin, Total 04/15/2022 0.6    Protein, Total 04/15/2022 6.5    A/G Ratio 04/15/2022 2.1    WBC (White Blood Cell Co* 04/15/2022 5.7    RBC (Red Blood Cell Coun* 04/15/2022 4.43    Hemoglobin 04/15/2022 13.5    Hematocrit 04/15/2022 41.0    MCV (Mean Corpuscular Vo* 04/15/2022 92.6    MCH (Mean Corpuscular He* 04/15/2022 30.5    MCHC (Mean Corpuscular H* 04/15/2022 32.9    Platelet Count 04/15/2022 248    RDW-CV (Red Cell Distrib* 04/15/2022 12.1    MPV (Mean Platelet Volum* 04/15/2022 8.9 (L)    Neutrophils 04/15/2022 3.74    Lymphocytes 04/15/2022 1.26    Monocytes 04/15/2022 0.43    Eosinophils 04/15/2022 0.23    Basophils 04/15/2022 0.05    Neutrophil %  04/15/2022 65.4    Lymphocyte % 04/15/2022 22.0    Monocyte % 04/15/2022 7.5    Eosinophil % 04/15/2022 4.0    Basophil% 04/15/2022 0.9    Immature Granulocyte % 04/15/2022 0.2    Immature Granulocyte Cou* 04/15/2022 0.01    Cholesterol, Total 04/15/2022 163  Triglyceride 04/15/2022 108    HDL (High Density Lipopr* 44/01/270 53.8    LDL Calculated 04/15/2022 88    VLDL Cholesterol 04/15/2022 22    Cholesterol/HDL Ratio 04/15/2022 3.0    Thyroid Stimulating Horm* 04/15/2022 1.040      ASSESSMENT: Ms. Machida is a 72 y.o. female presenting for consultation for ductal carcinoma in situ of the right breast.    Patient was oriented again about the pathology results. Surgical alternatives were discussed with patient including partial vs total mastectomy. Surgical technique and post operative care was discussed with patient. Risk of surgery was discussed with patient including but not limited to: wound infection, seroma, hematoma, brachial plexopathy, mondor's disease (thrombosis of small veins of breast), chronic wound pain, breast lymphedema, altered sensation to the nipple and cosmesis among others.   Due to small area stratification, ductal carcinoma in situ, intermediate grade we discussed about alternative of partial mastectomy.  Patient agreed to proceed with partial mastectomy.  Patient evaluated today by medical oncology.  They discussed about most likely adjuvant radiation therapy and antiestrogen therapy  Ductal carcinoma of right breast (CMS/HHS-HCC) [C50.911]  PLAN: 1.  Right breast radiofrequency tag partial mastectomy 2.  Hold aspirin 5 days before the surgery 3.  Contact us if you have any concern  Patient and and her husband verbalized understanding, all questions were answered, and were agreeable with the plan outlined above.     Carolan Shiver, MD  Electronically signed by Carolan Shiver, MD

## 2022-09-29 NOTE — H&P (Signed)
PATIENT PROFILE: Lori Kidd is a 72 y.o. female who presents to the Clinic for consultation at the request of Dr. Burnadette Pop for evaluation of ductal carcinoma in situ of right breast.  PCP:  Marisue Ivan, MD  HISTORY OF PRESENT ILLNESS: Lori Kidd reports she had her usual screening mammogram on 09/04/2022.  She was found with small area of concern in calcification of the right breast.  Diagnostic mammogram confirmed a 0.9 cm group of pleomorphic calcification on the outer right breast.  Stereotactic core biopsy done on 09/22/2022 showed ductal carcinoma in situ, intermediate grade with comedonecrosis.  Patient denies any previous nipple discharge, breast pain, skin changes, palpable masses.  Family history of breast cancer: Sister Family history of other cancers: None Previous breast biopsies: None  PROBLEM LIST: Problem List  Date Reviewed: 04/22/2022          Noted   Meniere disease, bilateral 10/11/2020   Medicare annual wellness visit, subsequent 04/22/22 04/08/2020   Medicare annual wellness visit, initial 04/05/19 04/05/2019   Status post total right knee replacement 03/13/2018   Mild intermittent asthma without complication (HHS-HCC) 11/15/2013   Essential hypertension Unknown   Acquired hypothyroidism (TSH 1 - 04/15/22) Unknown   Mixed hyperlipidemia (LDL 88 - 04/15/22) Unknown    GENERAL REVIEW OF SYSTEMS:   General ROS: negative for - chills, fatigue, fever, weight gain or weight loss Allergy and Immunology ROS: negative for - hives  Hematological and Lymphatic ROS: negative for - bleeding problems or bruising, negative for palpable nodes Endocrine ROS: negative for - heat or cold intolerance, hair changes Respiratory ROS: negative for - cough, shortness of breath or wheezing Cardiovascular ROS: no chest pain or palpitations GI ROS: negative for nausea, vomiting, abdominal pain, diarrhea, constipation Musculoskeletal ROS: negative for - joint swelling or muscle  pain Neurological ROS: negative for - confusion, syncope Dermatological ROS: negative for pruritus and rash Psychiatric: negative for anxiety, depression, difficulty sleeping and memory loss  MEDICATIONS: Current Outpatient Medications  Medication Sig Dispense Refill   acetaminophen (TYLENOL) 650 MG ER tablet Take 650 mg by mouth every 8 (eight) hours as needed       albuterol 90 mcg/actuation inhaler Inhale 2 inhalations into the lungs every 4 (four) hours as needed for Wheezing 8.5 each 1   aspirin 81 MG EC tablet Take 81 mg by mouth once daily.     cholecalciferol (VITAMIN D3) 1000 unit capsule Take 1,000 Units by mouth once daily     clindamycin (CLEOCIN) 300 MG capsule TAKE 2 CAPSULES BY MOUTH 1 HOUR PRIOR TO DENTAL APPOINTMENT     cyanocobalamin (VITAMIN B12) 1000 MCG tablet Take 1,000 mcg by mouth once daily     fluticasone propionate (FLONASE) 50 mcg/actuation nasal spray SPRAY 2 SPRAYS INTO EACH NOSTRIL EVERY DAY 48 g 1   levothyroxine (SYNTHROID) 100 MCG tablet TAKE 1 TABLET ONCE DAILY ON AN EMPTY STOMACH WITH A GLASS OF WATER AT LEAST 30-60 MINUTES BEFORE BREAKFAST 90 tablet 3   loratadine (CLARITIN) 10 mg capsule Take 10 mg by mouth once daily        omega 3-dha-epa-fish oil 1,000 mg (120 mg-180 mg) Cap Take 2 capsules by mouth once daily.       pravastatin (PRAVACHOL) 40 MG tablet take 1 tablet at bedtime 90 tablet 3   tretinoin (RETIN-A) 0.025 % cream SMARTSIG:sparingly Topical Every Evening     triamterene-hydroCHLOROthiazide (MAXZIDE-25) 37.5-25 mg tablet take 1 tablet every day 90 tablet 3   fluticasone furoate (ARNUITY  ELLIPTA) 100 mcg/actuation DsDv Inhale 1 Puff (1 spray total) into the lungs once daily (Patient not taking: Reported on 09/29/2022) 30 each 3   No current facility-administered medications for this visit.    ALLERGIES: Ace inhibitors and Penicillins  PAST MEDICAL HISTORY: Past Medical History:  Diagnosis Date   Allergic rhinitis    Allergy     Seasonal   Arthritis    Asthma without status asthmaticus (HHS-HCC) 2005   Well managed   Breast cancer (CMS/HHS-HCC) 09/2022   Encounter for blood transfusion 2003   Hyperlipidemia    Hypertension    Hypothyroidism     PAST SURGICAL HISTORY: Past Surgical History:  Procedure Laterality Date   APPENDECTOMY  1982   TUBAL LIGATION  1983   COLON SURGERY  1998   Sigmoid Resection   COLONOSCOPY  01/02/1997   Diverticulitis of the sigmoid colon w/71mm Adenomatous Polyp; Sigmoid Colectomy   HYSTERECTOMY  06/2001   COLONOSCOPY  10/05/2003   PH Adenomatous Polyp   COLONOSCOPY  08/01/2009   PH Adenomatous Polyp: CBF 07/2014; Recall Ltr mailed 06/07/2014 (dw)   COLONOSCOPY N/A 01/02/2015   Dr. Maggie Font @ [facility] - PHPolyp: CBF 12/2019   Right total knee arthroplasty using computer-assisted navigation  01/24/2018   Dr Ernest Pine   COLONOSCOPY  01/29/2021   Internal hemorrhoids/Diverticulosis/Otherwise normal/PHx CP/Repeat 30yrs/TKT   Sigmoid resection in '98 for diverticulitis     TONSILLECTOMY AND ADENOIDECTOMY       FAMILY HISTORY: Family History  Problem Relation Name Age of Onset   Myocardial Infarction (Heart attack) Mother [place]    Coronary Artery Disease (Blocked arteries around heart) Mother [place]    Hyperlipidemia (Elevated cholesterol) Mother [place]    High blood pressure (Hypertension) Mother [place]    Osteoarthritis Mother [place]    Breast cancer Sister     No Known Problems Brother     Colon polyps Sister TammyJenkins    High blood pressure (Hypertension) Sister TammyJenkins    Osteoarthritis Sister TammyJenkins    Thyroid disease Sister TammyJenkins    Breast cancer Sister Celesta Gentile        2012   Thyroid disease Sister Celesta Gentile    Colon polyps Father Titus Dubin    Hyperlipidemia (Elevated cholesterol) Father Titus Dubin    Obesity Father Titus Dubin    Osteoarthritis Father Titus Dubin    Osteoarthritis  Brother Marina Goodell    Thyroid disease Sister Cecille [place]      SOCIAL HISTORY: Social History   Socioeconomic History   Marital status: Married    Spouse name: Lori Kidd   Number of children: 2   Years of education: 16  Occupational History   Occupation: Retired Runner, broadcasting/film/video  Tobacco Use   Smoking status: Former    Current packs/day: 0.00    Average packs/day: 0.1 packs/day for 4.0 years (0.4 ttl pk-yrs)    Types: Cigarettes    Start date: 01/27/1971    Quit date: 01/27/1975    Years since quitting: 47.7    Passive exposure: Past   Smokeless tobacco: Never   Tobacco comments:    Smoked 10 cigarettes weekly- smoked x 4-5 years  Vaping Use   Vaping status: Never Used  Substance and Sexual Activity   Alcohol use: Yes    Alcohol/week: 2.0 standard drinks of alcohol    Types: 2 Standard drinks or equivalent per week    Comment: Occasional   Drug use: No   Sexual activity: Yes  Partners: Male  Social History Narrative   Marital status- Married   Lives with husband   Employment- Research scientist (life sciences))   Supplements- Glucosamine & Chronditin   Exercise hx- None   Religious affliation- Christian   Social Determinants of Health   Financial Resource Strain: Low Risk  (04/22/2022)   Overall Financial Resource Strain (CARDIA)    Difficulty of Paying Living Expenses: Not hard at all  Food Insecurity: No Food Insecurity (09/29/2022)   Received from [facility]   Hunger Vital Sign    Worried About Running Out of Food in the Last Year: Never true    Ran Out of Food in the Last Year: Never true  Transportation Needs: No Transportation Needs (04/22/2022)   PRAPARE - Administrator, Civil Service (Medical): No    Lack of Transportation (Non-Medical): No    PHYSICAL EXAM: Vitals:   09/29/22 1402  BP: 130/85  Pulse: 96   Body mass index is 26.57 kg/m. Weight: 68 kg (150 lb)   GENERAL: Alert, active, oriented x3  HEENT: Pupils equal reactive to light.  Extraocular movements are intact. Sclera clear. Palpebral conjunctiva normal red color.Pharynx clear.  NECK: Supple with no palpable mass and no adenopathy.  LUNGS: Sound clear with no rales rhonchi or wheezes.  HEART: Regular rhythm S1 and S2 without murmur.  BREAST: breasts appear normal, no suspicious masses, no skin or nipple changes or axillary nodes.  ABDOMEN: Soft and depressible, nontender with no palpable mass, no hepatomegaly.  EXTREMITIES: Well-developed well-nourished symmetrical with no dependent edema.  NEUROLOGICAL: Awake alert oriented, facial expression symmetrical, moving all extremities.  REVIEW OF DATA: I have reviewed the following data today: No visits with results within 3 Month(s) from this visit.  Latest known visit with results is:  Ancillary Orders on 04/15/2022  Component Date Value   Glucose 04/15/2022 85    Sodium 04/15/2022 142    Potassium 04/15/2022 3.6    Chloride 04/15/2022 104    Carbon Dioxide (CO2) 04/15/2022 28.4    Urea Nitrogen (BUN) 04/15/2022 16    Creatinine 04/15/2022 0.7    Glomerular Filtration Ra* 04/15/2022 94    Calcium 04/15/2022 9.4    AST  04/15/2022 17    ALT  04/15/2022 12    Alk Phos (alkaline Phosp* 04/15/2022 84    Albumin 04/15/2022 4.4    Bilirubin, Total 04/15/2022 0.6    Protein, Total 04/15/2022 6.5    A/G Ratio 04/15/2022 2.1    WBC (White Blood Cell Co* 04/15/2022 5.7    RBC (Red Blood Cell Coun* 04/15/2022 4.43    Hemoglobin 04/15/2022 13.5    Hematocrit 04/15/2022 41.0    MCV (Mean Corpuscular Vo* 04/15/2022 92.6    MCH (Mean Corpuscular He* 04/15/2022 30.5    MCHC (Mean Corpuscular H* 04/15/2022 32.9    Platelet Count 04/15/2022 248    RDW-CV (Red Cell Distrib* 04/15/2022 12.1    MPV (Mean Platelet Volum* 04/15/2022 8.9 (L)    Neutrophils 04/15/2022 3.74    Lymphocytes 04/15/2022 1.26    Monocytes 04/15/2022 0.43    Eosinophils 04/15/2022 0.23    Basophils 04/15/2022 0.05    Neutrophil %  04/15/2022 65.4    Lymphocyte % 04/15/2022 22.0    Monocyte % 04/15/2022 7.5    Eosinophil % 04/15/2022 4.0    Basophil% 04/15/2022 0.9    Immature Granulocyte % 04/15/2022 0.2    Immature Granulocyte Cou* 04/15/2022 0.01    Cholesterol, Total 04/15/2022 163  Triglyceride 04/15/2022 108    HDL (High Density Lipopr* 44/01/270 53.8    LDL Calculated 04/15/2022 88    VLDL Cholesterol 04/15/2022 22    Cholesterol/HDL Ratio 04/15/2022 3.0    Thyroid Stimulating Horm* 04/15/2022 1.040      ASSESSMENT: Ms. Machida is a 72 y.o. female presenting for consultation for ductal carcinoma in situ of the right breast.    Patient was oriented again about the pathology results. Surgical alternatives were discussed with patient including partial vs total mastectomy. Surgical technique and post operative care was discussed with patient. Risk of surgery was discussed with patient including but not limited to: wound infection, seroma, hematoma, brachial plexopathy, mondor's disease (thrombosis of small veins of breast), chronic wound pain, breast lymphedema, altered sensation to the nipple and cosmesis among others.   Due to small area stratification, ductal carcinoma in situ, intermediate grade we discussed about alternative of partial mastectomy.  Patient agreed to proceed with partial mastectomy.  Patient evaluated today by medical oncology.  They discussed about most likely adjuvant radiation therapy and antiestrogen therapy  Ductal carcinoma of right breast (CMS/HHS-HCC) [C50.911]  PLAN: 1.  Right breast radiofrequency tag partial mastectomy 2.  Hold aspirin 5 days before the surgery 3.  Contact us if you have any concern  Patient and and her husband verbalized understanding, all questions were answered, and were agreeable with the plan outlined above.     Carolan Shiver, MD  Electronically signed by Carolan Shiver, MD

## 2022-09-30 ENCOUNTER — Encounter: Payer: Self-pay | Admitting: *Deleted

## 2022-09-30 ENCOUNTER — Inpatient Hospital Stay (HOSPITAL_BASED_OUTPATIENT_CLINIC_OR_DEPARTMENT_OTHER): Payer: Medicare PPO | Admitting: Licensed Clinical Social Worker

## 2022-09-30 ENCOUNTER — Inpatient Hospital Stay: Payer: Medicare PPO

## 2022-09-30 DIAGNOSIS — Z803 Family history of malignant neoplasm of breast: Secondary | ICD-10-CM

## 2022-09-30 DIAGNOSIS — D0511 Intraductal carcinoma in situ of right breast: Secondary | ICD-10-CM | POA: Diagnosis not present

## 2022-09-30 DIAGNOSIS — Z801 Family history of malignant neoplasm of trachea, bronchus and lung: Secondary | ICD-10-CM | POA: Diagnosis not present

## 2022-09-30 NOTE — Progress Notes (Signed)
Lumpectomy is scheduled for 9/11.   She will see Dr. Smith Robert and Dr. Rushie Chestnut back on 9/25.  Appt details given to her.

## 2022-09-30 NOTE — Progress Notes (Signed)
REFERRING PROVIDER: Creig Hines, MD [address] [place],  [place] [address]  PRIMARY PROVIDER:  Marisue Ivan, MD  PRIMARY REASON FOR VISIT:  1. Ductal carcinoma in situ (DCIS) of right breast   2. Family history of breast cancer   3. Family history of lung cancer      HISTORY OF PRESENT ILLNESS:   Lori Kidd, a 72 y.o. female, was seen for a [facility] cancer genetics consultation at the request of Dr. Smith Robert due to a personal and family history of breast cancer.  Lori Kidd presents to clinic today to discuss the possibility of a hereditary predisposition to cancer, genetic testing, and to further clarify her future cancer risks, as well as potential cancer risks for family members.   CANCER HISTORY:  In 2024, at the age of 26, [name] was diagnosed with DCIS of the right breast. The treatment plan includes lumpectomy (scheduled for next week), most likely adjuvant radiation therapy and antiestrogen therapy.  RISK FACTORS:  Menarche was at age 82.  First live birth at age 70.  Ovaries intact: has 1 ovary.  Hysterectomy: yes.  Menopausal status: postmenopausal.  HRT use: 0 years. Colonoscopy: yes;  sigmoid resection for diverticulitis .  Past Medical History:  Diagnosis Date   Arthritis    Asthma    High blood pressure    High cholesterol    Hypothyroidism    Meniere disease    Pneumonia     Past Surgical History:  Procedure Laterality Date   APPENDECTOMY  1982   BREAST BIOPSY Right 09/22/2022   stereo bx, x-clip, path pending   BREAST BIOPSY Right 09/22/2022   MM RT BREAST BX W LOC DEV 1ST LESION IMAGE BX SPEC STEREO GUIDE 09/22/2022 [facility]   CATARACT EXTRACTION W/PHACO Right 03/17/2021   Procedure: CATARACT EXTRACTION PHACO AND INTRAOCULAR LENS PLACEMENT (IOC) RIGHT;  Surgeon: Nevada Crane, MD;  Location: [facility];  Service: Ophthalmology;  Laterality: Right;  4.96 00:35.7   COLONOSCOPY     Q5YRS SINCE 1998   COLONOSCOPY  WITH PROPOFOL N/A 01/29/2021   Procedure: COLONOSCOPY WITH PROPOFOL;  Surgeon: Toledo, Boykin Nearing, MD;  Location: [facility];  Service: Gastroenterology;  Laterality: N/A;   KNEE ARTHROPLASTY Right 01/24/2018   Procedure: COMPUTER ASSISTED TOTAL KNEE ARTHROPLASTY;  Surgeon: Donato Heinz, MD;  Location: [facility];  Service: Orthopedics;  Laterality: Right;   SIGMOID RESECTION / RECTOPEXY  1998   TONSILLECTOMY  1962   TUBAL LIGATION  1983   VAGINAL HYSTERECTOMY  2000    FAMILY HISTORY:  We obtained a detailed, 4-generation family history.  Significant diagnoses are listed below: Family History  Problem Relation Age of Onset   Breast cancer Sister        early 57's   Pneumonia Mother    Pneumonia Father    Lori Kidd has 1 daughter, 66, and 1 son, 27. She has 3 sisters and 1 brother. One sister, Bonita Quin, had breast cancer at 48. 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   Lori Kidd mother passed at 82-75 and there is no known cancer on this side of the family.  Lori Kidd father passed at 49. Paternal grandmother had lung cancer and died over age 6.  Lori Kidd is unaware of previous family history of genetic testing for hereditary cancer risks. There is no reported Ashkenazi Jewish ancestry. The is  no known consanguinity.    GENETIC COUNSELING ASSESSMENT: [name] is a 72 y.o. female with a personal and family history of breast cancer which is somewhat suggestive of a hereditary cancer syndrome and predisposition to cancer. We, therefore, discussed and recommended the following at today's visit.   DISCUSSION: We discussed that approximately 10% of breast cancer is hereditary. Most cases of hereditary breast cancer are associated with BRCA1/BRCA2 genes, although there are other genes associated with hereditary breast cancer as well. Cancers and risks are gene specific. We discussed that testing  is beneficial for several reasons including knowing about cancer risks, identifying potential screening and risk-reduction options that may be appropriate, and to understand if other family members could be at risk for cancer and allow them to undergo genetic testing.   We reviewed the characteristics, features and inheritance patterns of hereditary cancer syndromes. We also discussed genetic testing, including the appropriate family members to test, the process of testing, insurance coverage and turn-around-time for results. We discussed the implications of a negative, positive and/or variant of uncertain significant result. We recommended Lori Kidd pursue genetic testing for the [facility] Common Hereditary Cancers+RNA gene panel.   Based on Lori Kidd's personal and family history of cancer, she meets medical criteria for genetic testing. Despite that she meets criteria, she may still have an out of pocket cost.   PLAN: After considering the risks, benefits, and limitations, Lori Kidd provided informed consent to pursue genetic testing and the blood sample was sent to [facility] for analysis of the Common Hereditary Cancers+RNA panel. Results should be available within approximately 2-3 weeks' time, at which point they will be disclosed by telephone to Ms. Paprocki, as will any additional recommendations warranted by these results. Lori Kidd will receive a summary of her genetic counseling visit and a copy of her results once available. This information will also be available in Epic.   [name] questions were answered to her satisfaction today. Our contact information was provided should additional questions or concerns arise. Thank you for the referral and allowing [place] to share in the care of your patient.   Lacy Duverney, MS, [facility] Genetic Counselor [email or web address] Phone: [phone]  The patient was seen for a total of 30 minutes in face-to-face genetic  counseling. Patient's husband was also present. Dr. Blake Divine was available for discussion regarding this case.   _______________________________________________________________________ For Office Staff:  Number of people involved in session: 2 Was an Intern/ student involved with case: no

## 2022-10-01 ENCOUNTER — Ambulatory Visit
Admission: RE | Admit: 2022-10-01 | Discharge: 2022-10-01 | Disposition: A | Discharge status: Home / Self Care | Payer: Medicare PPO | Source: Ambulatory Visit | Attending: General Surgery | Admitting: General Surgery

## 2022-10-01 DIAGNOSIS — C50911 Malignant neoplasm of unspecified site of right female breast: Secondary | ICD-10-CM | POA: Insufficient documentation

## 2022-10-01 HISTORY — PX: BREAST BIOPSY: SHX20

## 2022-10-01 MED ORDER — LIDOCAINE HCL (PF) 1 % IJ SOLN
10.0000 mL | Freq: Once | INTRAMUSCULAR | Status: AC
  Administered 2022-10-01: 10 mL
  Filled 2022-10-01: qty 10

## 2022-10-02 ENCOUNTER — Other Ambulatory Visit: Payer: Self-pay

## 2022-10-02 ENCOUNTER — Encounter
Admission: RE | Admit: 2022-10-02 | Discharge: 2022-10-02 | Disposition: A | Discharge status: Home / Self Care | Payer: Medicare PPO | Source: Ambulatory Visit | Attending: General Surgery | Admitting: General Surgery

## 2022-10-02 HISTORY — DX: Occlusion and stenosis of unspecified carotid artery: I65.29

## 2022-10-02 HISTORY — DX: Peripheral vascular disease, unspecified: I73.9

## 2022-10-02 HISTORY — DX: Presence of right artificial knee joint: Z96.651

## 2022-10-02 HISTORY — DX: Malignant (primary) neoplasm, unspecified: C80.1

## 2022-10-02 NOTE — Patient Instructions (Addendum)
Your procedure is scheduled on:  Wednesday September 11  Report to the Registration Desk on the 1st floor of the [facility] To find out your arrival time, please call [phone] between 1PM - 3PM on:  Tuesday September 10  If your arrival time is 6:00 am, do not arrive before that time as the [facility] entrance doors do not open until 6:00 am.  REMEMBER: Instructions that are not followed completely may result in serious medical risk, up to and including death; or upon the discretion of your surgeon and anesthesiologist your surgery may need to be rescheduled.  Do not eat food after midnight the night before surgery.  No gum chewing or hard candies.  One week prior to surgery: Starting Friday September 6  Stop Anti-inflammatories (NSAIDS) such as Advil, Aleve, Ibuprofen, Motrin, Naproxen, Naprosyn and Aspirin based products such as Excedrin, Goody's Powder, BC Powder.  Stop ANY OVER THE COUNTER supplements until after surgery. Cholecalciferol  cyanocobalamin (VITAMIN B12 ) Omega-3   You may however, continue to take Tylenol if needed for pain up until the day of surgery.  Continue taking all prescribed medications with the exception of the following: triamterene-hydrochlorothiazide (MAXZIDE-25) hold the day of surgery    Follow recommendations from Cardiologist or PCP regarding stopping blood thinners.  TAKE ONLY THESE MEDICATIONS THE MORNING OF SURGERY WITH A SIP OF WATER:  levothyroxine (SYNTHROID, LEVOTHROID)  loratadine (CLARITIN)    Use inhalers on the day of surgery and bring to the hospital. albuterol (PROVENTIL HFA;VENTOLIN HFA)    No Alcohol for 24 hours before or after surgery.  No Smoking including e-cigarettes for 24 hours before surgery.  No chewable tobacco products for at least 6 hours before surgery.  No nicotine patches on the day of surgery.  Do not use any "recreational" drugs for at least a week (preferably 2 weeks) before your surgery.   Please be advised that the combination of cocaine and anesthesia may have negative outcomes, up to and including death. If you test positive for cocaine, your surgery will be cancelled.  On the morning of surgery brush your teeth with toothpaste and water, you may rinse your mouth with mouthwash if you wish. Do not swallow any toothpaste or mouthwash.  Use CHG Soap as directed on instruction sheet.  Do not wear jewelry, make-up, hairpins, clips or nail polish.  Do not wear lotions, powders, or perfumes.   Do not shave body hair from the neck down 48 hours before surgery.  Do not bring valuables to the hospital. [facility] is not responsible for any missing/lost belongings or valuables.   Notify your doctor if there is any change in your medical condition (cold, fever, infection).  Wear comfortable clothing (specific to your surgery type) to the hospital.  After surgery, you can help prevent lung complications by doing breathing exercises.  Take deep breaths and cough every 1-2 hours.  If you are being discharged the day of surgery, you will not be allowed to drive home. You will need a responsible individual to drive you home and stay with you for 24 hours after surgery.   If you are taking public transportation, you will need to have a responsible individual with you.  Please call the Pre-admissions Testing Dept. at [phone] if you have any questions about these instructions.  Surgery Visitation Policy:  Patients having surgery or a procedure may have two visitors.  Children under the age of 25 must have an adult with them who  is not the patient.            Preparing for Surgery with CHLORHEXIDINE GLUCONATE (CHG) Soap  Chlorhexidine Gluconate (CHG) Soap  o An antiseptic cleaner that kills germs and bonds with the skin to continue killing germs even after washing  o Used for showering the night before surgery and morning of surgery  Before surgery, you  can play an important role by reducing the number of germs on your skin.  CHG (Chlorhexidine gluconate) soap is an antiseptic cleanser which kills germs and bonds with the skin to continue killing germs even after washing.  Please do not use if you have an allergy to CHG or antibacterial soaps. If your skin becomes reddened/irritated stop using the CHG.  1. Shower the NIGHT BEFORE SURGERY and the MORNING OF SURGERY with CHG soap.  2. If you choose to wash your hair, wash your hair first as usual with your normal shampoo.  3. After shampooing, rinse your hair and body thoroughly to remove the shampoo.  4. Use CHG as you would any other liquid soap. You can apply CHG directly to the skin and wash gently with a scrungie or a clean washcloth.  5. Apply the CHG soap to your body only from the neck down. Do not use on open wounds or open sores. Avoid contact with your eyes, ears, mouth, and genitals (private parts). Wash face and genitals (private parts) with your normal soap.  6. Wash thoroughly, paying special attention to the area where your surgery will be performed.  7. Thoroughly rinse your body with warm water.  8. Do not shower/wash with your normal soap after using and rinsing off the CHG soap.  9. Pat yourself dry with a clean towel.  10. Wear clean pajamas to bed the night before surgery.  12. Place clean sheets on your bed the night of your first shower and do not sleep with pets.  13. Shower again with the CHG soap on the day of surgery prior to arriving at the hospital.  14. Do not apply any deodorants/lotions/powders.  15. Please wear clean clothes to the hospital.            Preparing for Surgery with CHLORHEXIDINE GLUCONATE (CHG) Soap  Chlorhexidine Gluconate (CHG) Soap  o An antiseptic cleaner that kills germs and bonds with the skin to continue killing germs even after washing  o Used for showering the night before surgery and morning of surgery  Before  surgery, you can play an important role by reducing the number of germs on your skin.  CHG (Chlorhexidine gluconate) soap is an antiseptic cleanser which kills germs and bonds with the skin to continue killing germs even after washing.  Please do not use if you have an allergy to CHG or antibacterial soaps. If your skin becomes reddened/irritated stop using the CHG.  1. Shower the NIGHT BEFORE SURGERY and the MORNING OF SURGERY with CHG soap.  2. If you choose to wash your hair, wash your hair first as usual with your normal shampoo.  3. After shampooing, rinse your hair and body thoroughly to remove the shampoo.  4. Use CHG as you would any other liquid soap. You can apply CHG directly to the skin and wash gently with a scrungie or a clean washcloth.  5. Apply the CHG soap to your body only from the neck down. Do not use on open wounds or open sores. Avoid contact with your eyes, ears, mouth, and genitals (private  parts). Wash face and genitals (private parts) with your normal soap.  6. Wash thoroughly, paying special attention to the area where your surgery will be performed.  7. Thoroughly rinse your body with warm water.  8. Do not shower/wash with your normal soap after using and rinsing off the CHG soap.  9. Pat yourself dry with a clean towel.  10. Wear clean pajamas to bed the night before surgery.  12. Place clean sheets on your bed the night of your first shower and do not sleep with pets.  13. Shower again with the CHG soap on the day of surgery prior to arriving at the hospital.  14. Do not apply any deodorants/lotions/powders.  15. Please wear clean clothes to the hospital.

## 2022-10-03 NOTE — Progress Notes (Signed)
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 Burnadette Pop  Date of visit: 10/03/22   History of presenting illness- Patient is a 72 year old female who underwent a routine screening mammogram in August 2024 which showed possible calcifications in the right breast.  This was followed by diagnostic mammogram which showed 0.9 cm group of pleomorphic calcifications in the right breast outer quadrant.  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  Family history of breast cancer in her sister.  No personal history of breast cancer or prior breast biopsies.  ECOG PS- 0  Pain scale- 0   Review of systems- Review of Systems  Constitutional:  Negative for chills, fever, malaise/fatigue and weight loss.  HENT:  Negative for congestion, ear discharge and nosebleeds.   Eyes:  Negative for blurred vision.  Respiratory:  Negative for cough, hemoptysis, sputum production, shortness of breath and wheezing.   Cardiovascular:  Negative for chest pain, palpitations, orthopnea and claudication.  Gastrointestinal:  Negative for abdominal pain, blood in stool, constipation, diarrhea, heartburn, melena, nausea and vomiting.  Genitourinary:  Negative for dysuria, flank pain, frequency, hematuria and urgency.  Musculoskeletal:  Negative for back pain, joint pain and myalgias.  Skin:  Negative for rash.  Neurological:  Negative for dizziness, tingling, focal weakness, seizures, weakness and headaches.  Endo/Heme/Allergies:  Does not bruise/bleed easily.  Psychiatric/Behavioral:  Negative for  depression and suicidal ideas. The patient does not have insomnia.     Allergies  Allergen Reactions   Ace Inhibitors Cough   Penicillins Rash and Other (See Comments)    Redness Has patient had a PCN reaction causing immediate rash, facial/tongue/throat swelling, SOB or lightheadedness with hypotension: Yes Has patient had a PCN reaction causing severe rash involving mucus membranes or skin necrosis: No Has patient had a PCN reaction that required hospitalization: No Has patient had a PCN reaction occurring within the last 10 years: No If all of the above answers are "NO", then may proceed with Cephalosporin use.     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/24/2018   Class 1 obesity due to excess calories without serious comorbidity with body mass index (BMI) of 30.0 to 30.9 in adult 10/06/2017   Acquired hypothyroidism 06/22/2017   Allergic rhinitis 06/22/2017   Essential hypertension 06/22/2017   Mixed hyperlipidemia 06/22/2017   Primary osteoarthritis of right knee 08/30/2016   Mild persistent asthma with acute exacerbation 11/15/2013   Mild intermittent asthma without complication 11/15/2013     Past Medical History:  Diagnosis Date   Arthritis    Asthma    Cancer (HCC)    Carotid stenosis    High blood pressure    High cholesterol    Hypothyroidism    Meniere disease    PAD (peripheral artery disease) (HCC)    Pneumonia    S/P TKR (total knee replacement), right  Past Surgical History:  Procedure Laterality Date   APPENDECTOMY  1982   BREAST BIOPSY Right 09/22/2022   stereo bx, x-clip, path pending   BREAST BIOPSY Right 09/22/2022   MM RT BREAST BX W LOC DEV 1ST LESION  IMAGE BX SPEC STEREO GUIDE 09/22/2022 [facility]   BREAST BIOPSY Right 10/01/2022   MM RT RADIO FREQUENCY TAG LOC MAMMO GUIDE 10/01/2022 [facility]   CATARACT EXTRACTION W/PHACO Right 03/17/2021   Procedure: CATARACT EXTRACTION PHACO AND INTRAOCULAR LENS PLACEMENT (IOC) RIGHT;  Surgeon: Nevada Crane, MD;  [facility]: [facility];  Service: Ophthalmology;  Laterality: Right;  4.96 00:35.7   COLONOSCOPY     Q5YRS SINCE 1998   COLONOSCOPY WITH PROPOFOL N/A 01/29/2021   Procedure: COLONOSCOPY WITH PROPOFOL;  Surgeon: Toledo, Boykin Nearing, MD;  Location: [facility];  Service: Gastroenterology;  Laterality: N/A;   KNEE ARTHROPLASTY Right 01/24/2018   Procedure: COMPUTER ASSISTED TOTAL KNEE ARTHROPLASTY;  Surgeon: Donato Heinz, MD;  Location: [facility];  Service: Orthopedics;  Laterality: Right;   SIGMOID RESECTION / RECTOPEXY  1998   TONSILLECTOMY  1962   TUBAL LIGATION  1983   VAGINAL HYSTERECTOMY  2000    Social History   Socioeconomic History   Marital status: Married    Spouse name: Kathlene November   Number of children: Not on file   Years of education: Not on file   Highest education level: Not on file  Occupational History   Occupation: Retired Runner, broadcasting/film/video  Tobacco Use   Smoking status: Former    Types: Cigarettes   Smokeless tobacco: Never  Vaping Use   Vaping status: Never Used  Substance and Sexual Activity   Alcohol use: Yes    Alcohol/week: 3.0 standard drinks of alcohol    Types: 3 Cans of beer per week    Comment: NONE LAST 24HRS   Drug use: Never   Sexual activity: Not on file  Other Topics Concern   Not on file  Social History Narrative   Not on file   Social Determinants of Health   Financial Resource Strain: Low Risk  (04/22/2022)   Received from [facility], [facility]   Overall Financial Resource Strain (CARDIA)    Difficulty of Paying Living Expenses: Not hard at all  Food Insecurity: No Food Insecurity  (09/29/2022)   Hunger Vital Sign    Worried About Running Out of Food in the Last Year: Never true    Ran Out of Food in the Last Year: Never true  Transportation Needs: No Transportation Needs (04/22/2022)   Received from [facility], [facility]   [facility] - Transportation    In the past 12 months, has lack of transportation kept you from medical appointments or from getting medications?: No    Lack of Transportation (Non-Medical): No  Physical Activity: Not on file  Stress: Not on file  Social Connections: Not on file  Intimate Partner Violence: Not At Risk (09/29/2022)   Humiliation, Afraid, Rape, and Kick questionnaire    Fear of Current or Ex-Partner: No    Emotionally Abused: No    Physically Abused: No    Sexually Abused: No     Family History  Problem Relation Age of Onset   Breast cancer Sister        early 61's   Pneumonia Mother    Pneumonia Father      Current Outpatient Medications:    Acetaminophen 500 MG capsule, Take by mouth  every 6 (six) hours as needed for fever., Disp: , Rfl:    albuterol (PROVENTIL HFA;VENTOLIN HFA) 108 (90 Base) MCG/ACT inhaler, Inhale 2 puffs into the lungs every 6 (six) hours as needed for wheezing or shortness of breath., Disp: , Rfl:    aspirin EC 81 MG tablet, Take 81 mg by mouth daily. Swallow whole., Disp: , Rfl:    Cholecalciferol 25 MCG (1000 UT) capsule, Take by mouth., Disp: , Rfl:    cyanocobalamin (VITAMIN B12) 1000 MCG tablet, Take 1,000 mcg by mouth daily., Disp: , Rfl:    EPINEPHrine 0.3 mg/0.3 mL IJ SOAJ injection, Inject 0.3 mg into the muscle once., Disp: , Rfl:    fluticasone (FLONASE) 50 MCG/ACT nasal spray, Place 1 spray into both nostrils daily., Disp: , Rfl:    levothyroxine (SYNTHROID, LEVOTHROID) 112 MCG tablet, Take 112 mcg by mouth daily before breakfast. , Disp: , Rfl:    loratadine (CLARITIN) 10 MG tablet, Take 10 mg by mouth daily as needed for allergies., Disp: , Rfl:     meclizine (ANTIVERT) 25 MG tablet, Take 25 mg by mouth 3 (three) times daily as needed for dizziness., Disp: , Rfl:    naproxen sodium (ALEVE) 220 MG tablet, Take 220 mg by mouth daily as needed., Disp: , Rfl:    Omega-3 1000 MG CAPS, Take 2,000 mg by mouth daily. , Disp: , Rfl:    pravastatin (PRAVACHOL) 40 MG tablet, Take 1 tablet by mouth at bedtime., Disp: , Rfl:    tretinoin (RETIN-A) 0.025 % cream, SMARTSIG:sparingly Topical Every Evening, Disp: , Rfl:    triamterene-hydrochlorothiazide (MAXZIDE-25) 37.5-25 MG tablet, Take 1 tablet by mouth daily., Disp: , Rfl:    Physical exam:  Vitals:   09/29/22 1049  BP: 120/81  Pulse: 81  Resp: 18  Temp: 98.6 F (37 C)  TempSrc: Tympanic  SpO2: 98%  Weight: 148 lb 4.8 oz (67.3 kg)  Height: 5\' 3"  (1.6 m)   Physical Exam Cardiovascular:     Rate and Rhythm: Normal rate and regular rhythm.     Heart sounds: Normal heart sounds.  Pulmonary:     Effort: Pulmonary effort is normal.     Breath sounds: Normal breath sounds.  Abdominal:     General: Bowel sounds are normal.     Palpations: Abdomen is soft.  Skin:    General: Skin is warm and dry.  Neurological:     Mental Status: She is alert and oriented to person, place, and time.    Breast exam: no palpable b/l breast masses. No palpable b/l axillary adenopathy    Latest Ref Rng & Units 01/12/2018    8:55 AM  CMP  Glucose 70 - 99 mg/dL 90   BUN 8 - 23 mg/dL 25   Creatinine 1.19 - 1.00 mg/dL 1.47   Sodium 829 - 562 mmol/L 139   Potassium 3.5 - 5.1 mmol/L 3.7   Chloride 98 - 111 mmol/L 103   CO2 22 - 32 mmol/L 29   Calcium 8.9 - 10.3 mg/dL 9.6   Total Protein 6.5 - 8.1 g/dL 8.1   Total Bilirubin 0.3 - 1.2 mg/dL 1.0   Alkaline Phos 38 - 126 U/L 72   AST 15 - 41 U/L 21   ALT 0 - 44 U/L 19       Latest Ref Rng & Units 01/26/2018    3:28 AM  CBC  WBC 4.0 - 10.5 K/uL 10.8   Hemoglobin 12.0 - 15.0 g/dL 10.0  Hematocrit 36.0 - 46.0 % 30.2   Platelets 150 - 400 K/uL 206      No images are attached to the encounter.  MM RT RADIO FREQUENCY TAG LOC MAMMO GUIDE  Result Date: 10/01/2022 CLINICAL DATA:  72 year old female presenting for SAVI scout localization. Patient has newly diagnosed right breast DCIS. EXAM: NEEDLE LOCALIZATION OF THE RIGHT BREAST WITH MAMMO GUIDANCE COMPARISON:  Previous exam(s). FINDINGS: Patient presents for needle localization prior to right breast lumpectomy. I met with the patient and we discussed the procedure of needle localization including benefits and alternatives. We discussed the high likelihood of a successful procedure. We discussed the risks of the procedure, including infection, bleeding, tissue injury, and further surgery. Informed, written consent was given. The usual time-out protocol was performed immediately prior to the procedure. Using mammographic guidance, sterile technique, 1% lidocaine and a 7 cm SAVI SCOUT needle, the X biopsy marking clip in the right breast was localized using a lateral approach. Reflector function was confirmed with an auditory signal from the SAVI SCOUT guide. The images were marked for Dr. Hazle Quant. IMPRESSION: Radar reflector localization of the right breast. No apparent complications. Electronically Signed   By: Emmaline Kluver M.D.   On: 10/01/2022 16:11   MM RT BREAST BX W LOC DEV 1ST LESION IMAGE BX SPEC STEREO GUIDE  Addendum Date: 09/23/2022   ADDENDUM REPORT: 09/23/2022 15:02 ADDENDUM: PATHOLOGY revealed: A. Breast, right, needle core biopsy - DUCTAL CARCINOMA IN SITU (DCIS) WITH FOCAL SOLID PAPILLARY FEATURES, INTERMEDIATE GRADE, WITH COMEDONECROSIS AND CALCIFICATIONS. Pathology results are CONCORDANT with imaging findings, per Dr. Ted Mcalpine. Pathology results and recommendations were discussed with patient via telephone on 09/23/2022. Patient reported biopsy site doing well with no adverse symptoms, and only slight tenderness at the site. Post biopsy care instructions were reviewed,  questions were answered and my direct phone number was provided. Patient was instructed to call [facility] for any additional questions or concerns related to biopsy site. RECOMMENDATIONS: 1. Surgical and oncological consultation. Request for surgical and oncological consultation relayed to Irving Shows RN at [facility] by Randa Lynn RN on 09/23/2022. 2. Consider bilateral breast MRI with and without contrast to assess extent of disease. Pathology results reported by Randa Lynn RN on 09/23/2022. Electronically Signed   By: Ted Mcalpine M.D.   On: 09/23/2022 15:02   Result Date: 09/23/2022 CLINICAL DATA:  Right breast lower outer quadrant suspicious calcifications. EXAM: RIGHT BREAST STEREOTACTIC CORE NEEDLE BIOPSY COMPARISON:  Previous exam(s). FINDINGS: The patient and I discussed the procedure of stereotactic-guided biopsy including benefits and alternatives. We discussed the high likelihood of a successful procedure. We discussed the risks of the procedure including infection, bleeding, tissue injury, clip migration, and inadequate sampling. Informed written consent was given. The usual time out protocol was performed immediately prior to the procedure. Using sterile technique and 1% Lidocaine as local anesthetic, under stereotactic guidance, a 9 gauge vacuum assisted device was used to perform core needle biopsy of calcifications in the lower outer quadrant of the right breast using a superior approach. Specimen radiograph was performed showing presence of calcifications. Specimens with calcifications are identified for pathology. Lesion quadrant: Lower outer quadrant At the conclusion of the procedure, X shaped tissue marker clip was deployed into the biopsy cavity. Follow-up 2-view mammogram was performed and dictated separately. IMPRESSION: Stereotactic-guided biopsy of right breast. No apparent complications. Electronically Signed: By: Ted Mcalpine M.D. On:  09/22/2022 09:19   MM CLIP PLACEMENT RIGHT  Result Date: 09/22/2022 CLINICAL DATA:  Right breast lower outer quadrant calcifications. EXAM: 3D DIAGNOSTIC RIGHT MAMMOGRAM POST STEREOTACTIC BIOPSY COMPARISON:  Previous exam(s). FINDINGS: 3D Mammographic images were obtained following stereotactic guided biopsy of the right breast. The biopsy marking clip is in expected position at the site of biopsy. IMPRESSION: Appropriate positioning of the X shaped biopsy marking clip at the site of biopsy in the right breast slightly lower outer quadrant, middle depth. Final Assessment: Post Procedure Mammograms for Marker Placement Electronically Signed   By: Ted Mcalpine M.D.   On: 09/22/2022 09:31   MM 3D DIAGNOSTIC MAMMOGRAM UNILATERAL RIGHT BREAST  Result Date: 09/11/2022 CLINICAL DATA:  72 year old female presents for further evaluation of RIGHT breast calcifications identified on screening mammogram. EXAM: DIGITAL DIAGNOSTIC UNILATERAL RIGHT MAMMOGRAM WITH TOMOSYNTHESIS AND CAD TECHNIQUE: Right digital diagnostic mammography and breast tomosynthesis was performed. The images were evaluated with computer-aided detection. COMPARISON:  Previous exam(s). ACR Breast Density Category b: There are scattered areas of fibroglandular density. FINDINGS: Full field and magnification views of the RIGHT breast demonstrate a 0.9 cm group of pleomorphic calcifications within the slightly OUTER RIGHT breast, middle depth. IMPRESSION: 1. 0.9 cm group of suspicious OUTER RIGHT breast calcifications. Tissue sampling is recommended. RECOMMENDATION: Stereotactic guided RIGHT breast biopsy, which will be scheduled I have discussed the findings and recommendations with the patient. If applicable, a reminder letter will be sent to the patient regarding the next appointment. BI-RADS CATEGORY  4: Suspicious. Electronically Signed   By: Harmon Pier M.D.   On: 09/11/2022 14:31   VAS US CAROTID  Result Date: 09/09/2022 Carotid  Arterial Duplex Study Patient Name:  DELAINI HERINGER  Date of Exam:   09/08/2022 Medical Rec #: [id number]               Accession #:    [id number] Date of Birth: [birth year]               Patient Gender: F Patient Age:   10 years Exam Location:  [facility] Procedure:      VAS US CAROTID Referring Phys: Festus Barren --------------------------------------------------------------------------------  Indications:   Carotid artery disease. Risk Factors:  Hypertension, hyperlipidemia, past history of smoking. Other Factors: History of dizziness. Performing Technologist: Hardie Lora RVT  Examination Guidelines: A complete evaluation includes B-mode imaging, spectral Doppler, color Doppler, and power Doppler as needed of all accessible portions of each vessel. Bilateral testing is considered an integral part of a complete examination. Limited examinations for reoccurring indications may be performed as noted.  Right Carotid Findings: +----------+--------+--------+--------+-------------------+--------+           PSV cm/sEDV cm/sStenosisPlaque Description Comments +----------+--------+--------+--------+-------------------+--------+ CCA Prox  107     19                                          +----------+--------+--------+--------+-------------------+--------+ CCA Mid   66      18                                          +----------+--------+--------+--------+-------------------+--------+ CCA Distal54      13      <50%    smooth and calcific         +----------+--------+--------+--------+-------------------+--------+ ICA Prox  42  16      1-39%   smooth                      +----------+--------+--------+--------+-------------------+--------+ ICA Mid   74      32                                          +----------+--------+--------+--------+-------------------+--------+ ICA Distal95      30                                           +----------+--------+--------+--------+-------------------+--------+ ECA       97      16              smooth                      +----------+--------+--------+--------+-------------------+--------+ +----------+--------+-------+----------------+-------------------+           PSV cm/sEDV cmsDescribe        Arm Pressure (mmHG) +----------+--------+-------+----------------+-------------------+ [id number]             Multiphasic, WNL                    +----------+--------+-------+----------------+-------------------+ +---------+--------+--+--------+--+---------+ VertebralPSV cm/s81EDV cm/s22Antegrade +---------+--------+--+--------+--+---------+  Left Carotid Findings: +----------+--------+--------+--------+------------------+--------+           PSV cm/sEDV cm/sStenosisPlaque DescriptionComments +----------+--------+--------+--------+------------------+--------+ CCA Prox  88      22                                         +----------+--------+--------+--------+------------------+--------+ CCA Mid   69      16                                         +----------+--------+--------+--------+------------------+--------+ CCA Distal77      23                                         +----------+--------+--------+--------+------------------+--------+ ICA Prox  60      19      1-39%   diffuse                    +----------+--------+--------+--------+------------------+--------+ ICA Mid   59      18                                         +----------+--------+--------+--------+------------------+--------+ ICA Distal69      26                                         +----------+--------+--------+--------+------------------+--------+ ECA       55      11                                         +----------+--------+--------+--------+------------------+--------+ +----------+--------+--------+----------------+-------------------+  PSV  cm/sEDV cm/sDescribe        Arm Pressure (mmHG) +----------+--------+--------+----------------+-------------------+ [id number]              Multiphasic, WNL                    +----------+--------+--------+----------------+-------------------+ +---------+--------+--+--------+--+---------+ VertebralPSV cm/s50EDV cm/s18Antegrade +---------+--------+--+--------+--+---------+   Summary: Right Carotid: Velocities in the right ICA are consistent with a 1-39% stenosis.                Non-hemodynamically significant plaque <50% noted in the CCA. Left Carotid: There is no evidence of stenosis in the left ICA. Vertebrals:  Bilateral vertebral arteries demonstrate antegrade flow. Subclavians: Normal flow hemodynamics were seen in bilateral subclavian              arteries. *See table(s) above for measurements and observations.  Electronically signed by Festus Barren MD on 09/09/2022 at 3:19:51 PM.    Final    MM 3D SCREENING MAMMOGRAM BILATERAL BREAST  Result Date: 09/07/2022 CLINICAL DATA:  Screening. EXAM: DIGITAL SCREENING BILATERAL MAMMOGRAM WITH TOMOSYNTHESIS AND CAD TECHNIQUE: Bilateral screening digital craniocaudal and mediolateral oblique mammograms were obtained. Bilateral screening digital breast tomosynthesis was performed. The images were evaluated with computer-aided detection. COMPARISON:  Previous exam(s). ACR Breast Density Category b: There are scattered areas of fibroglandular density. FINDINGS: In the right breast, calcifications warrant further evaluation with magnified views. In the left breast, no findings suspicious for malignancy. IMPRESSION: Further evaluation is suggested for calcifications in the right breast. RECOMMENDATION: Diagnostic mammogram of the right breast. (Code:[id number]-R-[id number]) The patient will be contacted regarding the findings, and additional imaging will be scheduled. BI-RADS CATEGORY  0: Incomplete: Need additional imaging evaluation. Electronically Signed   By: Sande Brothers M.D.   On: 09/07/2022 13:26    Assessment and plan- Patient is a 72 y.o. female with new diagnosis of right breast DCIS  Discussed the results of mammogram and biopsy with the patient which shows 0.9 cm group of calcifications that was consistent with ER positive DCIS.  I would recommend upfront lumpectomy for her.  Ideally 2 mm negative margins are desired at the time of lumpectomy.  Following lumpectomy she would benefit from adjuvant radiation therapy as well as endocrine therapy since she is ER positive.  I will discuss [facility] DCIS algorithm with her after final pathology is back to determine the relative benefit of both radiation and endocrine therapy  In terms of endocrine therapy I would prefer AI over tamoxifen although both the drugs are equivalent in terms of overall survival benefit.  We will obtain a baseline bone density scan at this time.  Discussed risks and benefits of AI including all but not limited to hot flashes arthralgias mood swings and worsening bone health.  Treatment will be given with a curative intent.  I will also refer her to genetic counseling given that her sister also had history of breast cancer   Thank you for this kind referral and the opportunity to participate in the care of this  Patient   Visit Diagnosis 1. Ductal carcinoma in situ (DCIS) of right breast     Dr. Owens Shark, MD, MPH [facility] [phone] 10/03/2022

## 2022-10-05 ENCOUNTER — Encounter
Admission: RE | Admit: 2022-10-05 | Discharge: 2022-10-05 | Disposition: A | Discharge status: Home / Self Care | Payer: Medicare PPO | Source: Ambulatory Visit | Attending: General Surgery

## 2022-10-05 DIAGNOSIS — I10 Essential (primary) hypertension: Secondary | ICD-10-CM

## 2022-10-05 DIAGNOSIS — R9431 Abnormal electrocardiogram [ECG] [EKG]: Secondary | ICD-10-CM | POA: Diagnosis not present

## 2022-10-05 DIAGNOSIS — E782 Mixed hyperlipidemia: Secondary | ICD-10-CM | POA: Diagnosis not present

## 2022-10-05 DIAGNOSIS — Z01812 Encounter for preprocedural laboratory examination: Secondary | ICD-10-CM | POA: Diagnosis not present

## 2022-10-05 DIAGNOSIS — Z01810 Encounter for preprocedural cardiovascular examination: Secondary | ICD-10-CM | POA: Diagnosis not present

## 2022-10-05 DIAGNOSIS — Z01818 Encounter for other preprocedural examination: Secondary | ICD-10-CM | POA: Diagnosis present

## 2022-10-05 DIAGNOSIS — I739 Peripheral vascular disease, unspecified: Secondary | ICD-10-CM | POA: Diagnosis not present

## 2022-10-05 LAB — CBC
HCT: 42.3 % (ref 36.0–46.0)
Hemoglobin: 14.3 g/dL (ref 12.0–15.0)
MCH: 30.4 pg (ref 26.0–34.0)
MCHC: 33.8 g/dL (ref 30.0–36.0)
MCV: 89.8 fL (ref 80.0–100.0)
Platelets: 269 10*3/uL (ref 150–400)
RBC: 4.71 MIL/uL (ref 3.87–5.11)
RDW: 12.1 % (ref 11.5–15.5)
WBC: 5.7 10*3/uL (ref 4.0–10.5)
nRBC: 0 % (ref 0.0–0.2)

## 2022-10-05 LAB — BASIC METABOLIC PANEL
Anion gap: 8 (ref 5–15)
BUN: 18 mg/dL (ref 8–23)
CO2: 30 mmol/L (ref 22–32)
Calcium: 9.7 mg/dL (ref 8.9–10.3)
Chloride: 102 mmol/L (ref 98–111)
Creatinine, Ser: 0.57 mg/dL (ref 0.44–1.00)
GFR, Estimated: >60 mL/min (ref >60)
Glucose, Bld: 80 mg/dL (ref 70–99)
Potassium: 3.8 mmol/L (ref 3.5–5.1)
Sodium: 140 mmol/L (ref 135–145)

## 2022-10-06 ENCOUNTER — Ambulatory Visit
Admission: RE | Admit: 2022-10-06 | Discharge: 2022-10-06 | Disposition: A | Discharge status: Home / Self Care | Payer: Medicare PPO | Source: Ambulatory Visit | Attending: Oncology | Admitting: Oncology

## 2022-10-06 DIAGNOSIS — Z90710 Acquired absence of both cervix and uterus: Secondary | ICD-10-CM | POA: Diagnosis not present

## 2022-10-06 DIAGNOSIS — Z90722 Acquired absence of ovaries, bilateral: Secondary | ICD-10-CM | POA: Diagnosis not present

## 2022-10-06 DIAGNOSIS — Z780 Asymptomatic menopausal state: Secondary | ICD-10-CM | POA: Diagnosis not present

## 2022-10-06 DIAGNOSIS — E559 Vitamin D deficiency, unspecified: Secondary | ICD-10-CM | POA: Insufficient documentation

## 2022-10-06 DIAGNOSIS — D0511 Intraductal carcinoma in situ of right breast: Secondary | ICD-10-CM | POA: Insufficient documentation

## 2022-10-06 MED ORDER — CEFAZOLIN SODIUM-DEXTROSE 2-4 GM/100ML-% IV SOLN
2.0000 g | INTRAVENOUS | Status: AC
  Administered 2022-10-07: 2 g via INTRAVENOUS

## 2022-10-06 MED ORDER — FAMOTIDINE 20 MG PO TABS
20.0000 mg | ORAL_TABLET | Freq: Once | ORAL | Status: AC
  Administered 2022-10-07: 20 mg via ORAL

## 2022-10-06 MED ORDER — ORAL CARE MOUTH RINSE: 15.0000 mL | Freq: Once | OROMUCOSAL | Status: AC

## 2022-10-06 MED ORDER — CHLORHEXIDINE GLUCONATE 0.12 % MT SOLN
15.0000 mL | Freq: Once | OROMUCOSAL | Status: AC
  Administered 2022-10-07: 15 mL via OROMUCOSAL

## 2022-10-06 MED ORDER — LACTATED RINGERS IV SOLN: INTRAVENOUS | Status: DC

## 2022-10-07 ENCOUNTER — Encounter
Admission: RE | Disposition: A | Discharge status: Home / Self Care | Payer: Self-pay | Source: Home / Self Care | Attending: General Surgery

## 2022-10-07 ENCOUNTER — Ambulatory Visit
Admission: RE | Admit: 2022-10-07 | Discharge: 2022-10-07 | Disposition: A | Discharge status: Home / Self Care | Payer: Medicare PPO | Attending: General Surgery | Admitting: General Surgery

## 2022-10-07 ENCOUNTER — Ambulatory Visit: Payer: Medicare PPO | Admitting: Urgent Care

## 2022-10-07 ENCOUNTER — Encounter: Payer: Self-pay | Admitting: General Surgery

## 2022-10-07 ENCOUNTER — Other Ambulatory Visit: Payer: Self-pay

## 2022-10-07 ENCOUNTER — Ambulatory Visit: Payer: Medicare PPO | Admitting: Anesthesiology

## 2022-10-07 ENCOUNTER — Ambulatory Visit
Admission: RE | Admit: 2022-10-07 | Discharge: 2022-10-07 | Disposition: A | Discharge status: Home / Self Care | Payer: Medicare PPO | Source: Ambulatory Visit | Attending: General Surgery | Admitting: General Surgery

## 2022-10-07 DIAGNOSIS — I739 Peripheral vascular disease, unspecified: Secondary | ICD-10-CM | POA: Insufficient documentation

## 2022-10-07 DIAGNOSIS — I10 Essential (primary) hypertension: Secondary | ICD-10-CM | POA: Insufficient documentation

## 2022-10-07 DIAGNOSIS — D0511 Intraductal carcinoma in situ of right breast: Secondary | ICD-10-CM | POA: Diagnosis present

## 2022-10-07 DIAGNOSIS — Z170 Estrogen receptor positive status [ER+]: Secondary | ICD-10-CM | POA: Diagnosis not present

## 2022-10-07 DIAGNOSIS — Z87891 Personal history of nicotine dependence: Secondary | ICD-10-CM | POA: Diagnosis not present

## 2022-10-07 DIAGNOSIS — Z803 Family history of malignant neoplasm of breast: Secondary | ICD-10-CM | POA: Diagnosis not present

## 2022-10-07 DIAGNOSIS — C50911 Malignant neoplasm of unspecified site of right female breast: Secondary | ICD-10-CM | POA: Diagnosis not present

## 2022-10-07 DIAGNOSIS — J45909 Unspecified asthma, uncomplicated: Secondary | ICD-10-CM | POA: Insufficient documentation

## 2022-10-07 DIAGNOSIS — E782 Mixed hyperlipidemia: Secondary | ICD-10-CM

## 2022-10-07 DIAGNOSIS — Z01812 Encounter for preprocedural laboratory examination: Secondary | ICD-10-CM

## 2022-10-07 SURGERY — PARTIAL MASTECTOMY WITH RADIO FREQUENCY LOCALIZER
Anesthesia: General | Site: Chest | Laterality: Right

## 2022-10-07 MED ORDER — EPHEDRINE 5 MG/ML INJ
INTRAVENOUS | Status: AC
  Filled 2022-10-07: qty 5

## 2022-10-07 MED ORDER — ACETAMINOPHEN 10 MG/ML IV SOLN
INTRAVENOUS | Status: AC
  Filled 2022-10-07: qty 100

## 2022-10-07 MED ORDER — MIDAZOLAM HCL 2 MG/2ML IJ SOLN
INTRAMUSCULAR | Status: AC
  Filled 2022-10-07: qty 2

## 2022-10-07 MED ORDER — BUPIVACAINE-EPINEPHRINE (PF) 0.5% -1:200000 IJ SOLN
INTRAMUSCULAR | Status: DC | PRN
  Administered 2022-10-07: 30 mL

## 2022-10-07 MED ORDER — LIDOCAINE HCL (CARDIAC) PF 100 MG/5ML IV SOSY
PREFILLED_SYRINGE | INTRAVENOUS | Status: DC | PRN
  Administered 2022-10-07: 60 mg via INTRAVENOUS

## 2022-10-07 MED ORDER — OXYCODONE HCL 5 MG/5ML PO SOLN: 5.0000 mg | Freq: Once | ORAL | Status: DC | PRN

## 2022-10-07 MED ORDER — TRAMADOL HCL 50 MG PO TABS: 50.0000 mg | ORAL_TABLET | Freq: Four times a day (QID) | ORAL | 0 refills | Status: AC | PRN

## 2022-10-07 MED ORDER — ONDANSETRON HCL 4 MG/2ML IJ SOLN
INTRAMUSCULAR | Status: AC
  Filled 2022-10-07: qty 2

## 2022-10-07 MED ORDER — EPHEDRINE SULFATE-NACL 50-0.9 MG/10ML-% IV SOSY
PREFILLED_SYRINGE | INTRAVENOUS | Status: DC | PRN
Start: 2022-10-07 — End: 2022-10-07
  Administered 2022-10-07: 5 mg via INTRAVENOUS
  Administered 2022-10-07 (×2): 10 mg via INTRAVENOUS

## 2022-10-07 MED ORDER — FENTANYL CITRATE (PF) 100 MCG/2ML IJ SOLN
INTRAMUSCULAR | Status: AC
  Filled 2022-10-07: qty 2

## 2022-10-07 MED ORDER — ONDANSETRON HCL 4 MG/2ML IJ SOLN: 4.0000 mg | Freq: Once | INTRAMUSCULAR | Status: DC | PRN

## 2022-10-07 MED ORDER — ACETAMINOPHEN 10 MG/ML IV SOLN: 1000.0000 mg | Freq: Once | INTRAVENOUS | Status: DC | PRN

## 2022-10-07 MED ORDER — DEXAMETHASONE SODIUM PHOSPHATE 10 MG/ML IJ SOLN
INTRAMUSCULAR | Status: DC | PRN
  Administered 2022-10-07: 10 mg via INTRAVENOUS

## 2022-10-07 MED ORDER — PROPOFOL 10 MG/ML IV BOLUS
INTRAVENOUS | Status: DC | PRN
  Administered 2022-10-07: 150 mg via INTRAVENOUS

## 2022-10-07 MED ORDER — DEXAMETHASONE SODIUM PHOSPHATE 10 MG/ML IJ SOLN
INTRAMUSCULAR | Status: AC
  Filled 2022-10-07: qty 1

## 2022-10-07 MED ORDER — PROPOFOL 10 MG/ML IV BOLUS
INTRAVENOUS | Status: AC
  Filled 2022-10-07: qty 20

## 2022-10-07 MED ORDER — ONDANSETRON HCL 4 MG/2ML IJ SOLN
INTRAMUSCULAR | Status: DC | PRN
  Administered 2022-10-07: 4 mg via INTRAVENOUS

## 2022-10-07 MED ORDER — FENTANYL CITRATE (PF) 100 MCG/2ML IJ SOLN
INTRAMUSCULAR | Status: DC | PRN
  Administered 2022-10-07 (×2): 25 ug via INTRAVENOUS

## 2022-10-07 MED ORDER — BUPIVACAINE HCL (PF) 0.5 % IJ SOLN
INTRAMUSCULAR | Status: AC
  Filled 2022-10-07: qty 30

## 2022-10-07 MED ORDER — CEFAZOLIN SODIUM-DEXTROSE 2-4 GM/100ML-% IV SOLN
INTRAVENOUS | Status: AC
  Filled 2022-10-07: qty 100

## 2022-10-07 MED ORDER — CHLORHEXIDINE GLUCONATE 0.12 % MT SOLN
OROMUCOSAL | Status: AC
  Filled 2022-10-07: qty 15

## 2022-10-07 MED ORDER — FAMOTIDINE 20 MG PO TABS
ORAL_TABLET | ORAL | Status: AC
  Filled 2022-10-07: qty 1

## 2022-10-07 MED ORDER — EPINEPHRINE PF 1 MG/ML IJ SOLN
INTRAMUSCULAR | Status: AC
  Filled 2022-10-07: qty 1

## 2022-10-07 MED ORDER — OXYCODONE HCL 5 MG PO TABS: 5.0000 mg | ORAL_TABLET | Freq: Once | ORAL | Status: DC | PRN

## 2022-10-07 MED ORDER — FENTANYL CITRATE (PF) 100 MCG/2ML IJ SOLN: 25.0000 ug | INTRAMUSCULAR | Status: DC | PRN

## 2022-10-07 MED ORDER — MIDAZOLAM HCL 2 MG/2ML IJ SOLN
INTRAMUSCULAR | Status: DC | PRN
  Administered 2022-10-07: 2 mg via INTRAVENOUS

## 2022-10-07 MED ORDER — LACTATED RINGERS IV SOLN: INTRAVENOUS | Status: DC

## 2022-10-07 MED ORDER — ACETAMINOPHEN 10 MG/ML IV SOLN
INTRAVENOUS | Status: DC | PRN
  Administered 2022-10-07: 1000 mg via INTRAVENOUS

## 2022-10-07 SURGICAL SUPPLY — 41 items
ADH SKN CLS APL DERMABOND .7 (GAUZE/BANDAGES/DRESSINGS) ×1
APL PRP STRL LF DISP 70% ISPRP (MISCELLANEOUS) ×1
BLADE SURG 15 STRL LF DISP TIS (BLADE) ×1 IMPLANT
BLADE SURG 15 STRL SS (BLADE) ×1
CHLORAPREP W/TINT 26 (MISCELLANEOUS) ×1 IMPLANT
CNTNR URN SCR LID CUP LEK RST (MISCELLANEOUS) ×1 IMPLANT
CONT SPEC 4OZ STRL OR WHT (MISCELLANEOUS) ×1
DERMABOND ADVANCED .7 DNX12 (GAUZE/BANDAGES/DRESSINGS) ×1 IMPLANT
DEVICE DUBIN SPECIMEN MAMMOGRA (MISCELLANEOUS) ×1 IMPLANT
DRAPE LAPAROTOMY TRNSV 106X77 (MISCELLANEOUS) ×1 IMPLANT
ELECT CAUTERY BLADE TIP 2.5 (TIP) ×1
ELECT REM PT RETURN 9FT ADLT (ELECTROSURGICAL) ×1
ELECTRODE CAUTERY BLDE TIP 2.5 (TIP) ×1 IMPLANT
ELECTRODE REM PT RTRN 9FT ADLT (ELECTROSURGICAL) ×1 IMPLANT
GLOVE BIO SURGEON STRL SZ 6.5 (GLOVE) ×1 IMPLANT
GLOVE BIOGEL PI IND STRL 6.5 (GLOVE) ×1 IMPLANT
GOWN STRL REUS W/ TWL LRG LVL3 (GOWN DISPOSABLE) ×3 IMPLANT
GOWN STRL REUS W/TWL LRG LVL3 (GOWN DISPOSABLE) ×2
KIT MARKER MARGIN INK (KITS) IMPLANT
KIT TURNOVER KIT A (KITS) ×1 IMPLANT
LABEL OR SOLS (LABEL) ×1 IMPLANT
MANIFOLD NEPTUNE II (INSTRUMENTS) ×1 IMPLANT
MARKER MARGIN CORRECT CLIP (MARKER) IMPLANT
NDL HYPO 25X1 1.5 SAFETY (NEEDLE) ×1 IMPLANT
NEEDLE HYPO 25X1 1.5 SAFETY (NEEDLE) ×1 IMPLANT
PACK BASIN MINOR ARMC (MISCELLANEOUS) ×1 IMPLANT
RETRACTOR RING XSMALL (MISCELLANEOUS) IMPLANT
RTRCTR WOUND ALEXIS 13CM XS SH (MISCELLANEOUS)
SHEATH BREAST BIOPSY SKIN MKR (SHEATH) ×1 IMPLANT
SUT MNCRL 4-0 (SUTURE) ×1
SUT MNCRL 4-0 27XMFL (SUTURE) ×1
SUT SILK 2 0 SH (SUTURE) ×1 IMPLANT
SUT VIC AB 3-0 SH 27 (SUTURE) ×1
SUT VIC AB 3-0 SH 27X BRD (SUTURE) ×1 IMPLANT
SUTURE MNCRL 4-0 27XMF (SUTURE) ×1 IMPLANT
SYR 10ML LL (SYRINGE) ×1 IMPLANT
SYR BULB IRRIG 60ML STRL (SYRINGE) ×1 IMPLANT
TRAP FLUID SMOKE EVACUATOR (MISCELLANEOUS) ×1 IMPLANT
TRAP NEPTUNE SPECIMEN COLLECT (MISCELLANEOUS) ×1 IMPLANT
WATER STERILE IRR 1000ML POUR (IV SOLUTION) ×1 IMPLANT
WATER STERILE IRR 500ML POUR (IV SOLUTION) ×1 IMPLANT

## 2022-10-07 NOTE — Discharge Instructions (Addendum)
  Diet: Resume home heart healthy regular diet.   Activity: Increase activity as tolerated. Light activity and walking are encouraged. Do not drive or drink alcohol if taking narcotic pain medications.  Wound care: May shower with soapy water and pat dry (do not rub incisions), but no baths or submerging incision underwater until follow-up. (no swimming)   Medications: Resume all home medications. For mild to moderate pain: acetaminophen (Tylenol) or ibuprofen (if no kidney disease). Combining Tylenol with alcohol can substantially increase your risk of causing liver disease. Narcotic pain medications, if prescribed, can be used for severe pain, though may cause nausea, constipation, and drowsiness. If you do not need the narcotic pain medication, you do not need to fill the prescription.  Call office ([phone]) at any time if any questions, worsening pain, fevers/chills, bleeding, drainage from incision site, or other concerns.  AMBULATORY SURGERY  DISCHARGE INSTRUCTIONS   The drugs that you were given will stay in your system until tomorrow so for the next 24 hours you should not:  Drive an automobile Make any legal decisions Drink any alcoholic beverage   You may resume regular meals tomorrow.  Today it is better to start with liquids and gradually work up to solid foods.  You may eat anything you prefer, but it is better to start with liquids, then soup and crackers, and gradually work up to solid foods.   Please notify your doctor immediately if you have any unusual bleeding, trouble breathing, redness and pain at the surgery site, drainage, fever, or pain not relieved by medication.    Additional Instructions:  Please contact your physician with any problems or Same Day Surgery at [phone], Monday through Friday 6 am to 4 pm, or [facility] number at [phone].  

## 2022-10-07 NOTE — Anesthesia Procedure Notes (Signed)
Procedure Name: LMA Insertion Date/Time: 10/07/2022 12:15 PM  Performed by: Omer Jack, CRNAPre-anesthesia Checklist: Patient identified, Patient being monitored, Timeout performed, Emergency Drugs available and Suction available Patient Re-evaluated:Patient Re-evaluated prior to induction Oxygen Delivery Method: Circle system utilized Preoxygenation: Pre-oxygenation with 100% oxygen Induction Type: IV induction Ventilation: Mask ventilation without difficulty LMA: LMA inserted LMA Size: 4.0 Tube type: Oral Number of attempts: 1 Placement Confirmation: positive ETCO2 and breath sounds checked- equal and bilateral Tube secured with: Tape Dental Injury: Teeth and Oropharynx as per pre-operative assessment

## 2022-10-07 NOTE — Anesthesia Postprocedure Evaluation (Signed)
Anesthesia Post Note  Patient: Lori Kidd  Procedure(s) Performed: PARTIAL MASTECTOMY WITH RADIO FREQUENCY LOCALIZER (Right: Chest)  Patient location during evaluation: PACU Anesthesia Type: General Level of consciousness: awake and alert Pain management: pain level controlled Vital Signs Assessment: post-procedure vital signs reviewed and stable Respiratory status: spontaneous breathing, nonlabored ventilation, respiratory function stable and patient connected to nasal cannula oxygen Cardiovascular status: blood pressure returned to baseline and stable Postop Assessment: no apparent nausea or vomiting Anesthetic complications: no   No notable events documented.   Last Vitals:  Vitals:   10/07/22 1330 10/07/22 1339  BP: 134/62 137/68  Pulse: 79 78  Resp: 12 19  Temp:  36.6 C  SpO2: 100% 100%    Last Pain:  Vitals:   10/07/22 1339  TempSrc:   PainSc: 0-No pain                 Cleda Mccreedy Ellarie Picking

## 2022-10-07 NOTE — Interval H&P Note (Signed)
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  The various methods of treatment have been discussed with the patient and family. After consideration of risks, benefits and other options for treatment, the patient has consented to  Procedure(s): PARTIAL MASTECTOMY WITH RADIO FREQUENCY LOCALIZER (Right) as a surgical intervention.  The patient's history has been reviewed, patient examined, no change in status, stable for surgery.  I have reviewed the patient's chart and labs.  Questions were answered to the patient's satisfaction.     Carolan Shiver

## 2022-10-07 NOTE — Transfer of Care (Signed)
Immediate Anesthesia Transfer of Care Note  Patient: Lori Kidd  Procedure(s) Performed: PARTIAL MASTECTOMY WITH RADIO FREQUENCY LOCALIZER (Right: Chest)  Patient Location: PACU  Anesthesia Type:General  Level of Consciousness: drowsy and patient cooperative  Airway & Oxygen Therapy: Patient Spontanous Breathing and Patient connected to nasal cannula oxygen  Post-op Assessment: Report given to RN and Post -op Vital signs reviewed and stable  Post vital signs: Reviewed and stable  Last Vitals:  Vitals Value Taken Time  BP 122/61 10/07/22 1318  Temp 36.2 C 10/07/22 1318  Pulse 70 10/07/22 1325  Resp 11 10/07/22 1325  SpO2 100 % 10/07/22 1325  Vitals shown include unfiled device data.  Last Pain:  Vitals:   10/07/22 0920  TempSrc: Temporal  PainSc: 0-No pain         Complications: No notable events documented.

## 2022-10-07 NOTE — Anesthesia Preprocedure Evaluation (Addendum)
Anesthesia Evaluation  Patient identified by MRN, date of birth, ID band Patient awake    Reviewed: Allergy & Precautions, NPO status , Patient's Chart, lab work & pertinent test results  History of Anesthesia Complications Negative for: history of anesthetic complications  Airway Mallampati: III   Neck ROM: Full    Dental no notable dental hx.    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) Abnormal ECG When compared with ECG of 12-Jan-2018 08:49, No significant change was found   Neuro/Psych Meniere disease    GI/Hepatic negative GI ROS,,,  Endo/Other  Hypothyroidism    Renal/GU negative Renal ROS     Musculoskeletal   Abdominal   Peds  Hematology negative hematology ROS (+)   Anesthesia Other Findings   Reproductive/Obstetrics                             Anesthesia Physical Anesthesia Plan  ASA: 2  Anesthesia Plan: General   Post-op Pain Management:    Induction: Intravenous  PONV Risk Score and Plan: 3 and Ondansetron, Dexamethasone and Treatment may vary due to age or medical condition  Airway Management Planned: LMA  Additional Equipment:   Intra-op Plan:   Post-operative Plan: Extubation in OR  Informed Consent: I have reviewed the patients History and Physical, chart, labs and discussed the procedure including the risks, benefits and alternatives for the proposed anesthesia with the patient or authorized representative who has indicated his/her understanding and acceptance.     Dental advisory given  Plan Discussed with: CRNA  Anesthesia Plan Comments: (Patient  consented for risks of anesthesia including but not limited to:  - adverse reactions to medications - damage to eyes, teeth, lips or other oral mucosa - nerve damage due to positioning  - sore throat or hoarseness - damage to heart, brain, nerves, lungs, other parts of body or loss of life  Informed patient about role of CRNA in peri- and intra-operative care.  Patient voiced understanding.)        Anesthesia Quick Evaluation

## 2022-10-07 NOTE — Op Note (Signed)
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 Postoperative diagnosis: Same.   Procedure: Right radiofrequency tag-localized partial mastectomy.                       Anesthesia: GETA  Surgeon: Dr. Hazle Quant  Wound Classification: Clean  Indications: Patient is a 72 y.o. female with a nonpalpable right breast mass noted on mammography with core biopsy demonstrating ductal carcinoma in situ requires radiofrequency tag-localized partial mastectomy for treatment.  Findings: 1. Specimen mammography shows marker and tag on specimen 2. Pathology call refers gross examination of margins was  3. No other palpable mass or lymph node identified.   Description of procedure: Preoperative radiofrequency tag localization was performed by radiology. Localization studies were reviewed. The patient was taken to the operating room and placed supine on the operating table, and after general anesthesia the right chest and axilla were prepped and draped in the usual sterile fashion. A time-out was completed verifying correct patient, procedure, site, positioning, and implant(s) and/or special equipment prior to beginning this procedure.  By comparing the localization studies and interrogation with [facility] device, the probable trajectory and location of the mass was visualized. A circumareolar skin incision was planned in such a way as to minimize the amount of dissection to reach the mass.  The skin incision was made. Flaps were raised and the location of the tag was confirmed with [facility] device confirmed. Dissection was then taken down circumferentially, taking care to include the entire localizing tag and a wide margin of grossly normal tissue. The specimen and entire localizing tag were removed. The specimen was oriented and sent to radiology with the localization studies. Confirmation was received that the entire target lesion had been resected. The wound was irrigated. Hemostasis was  checked. The wound was closed with interrupted sutures of 3-0 Vicryl and a subcuticular suture of Monocryl 3-0. No attempt was made to close the dead space.  The patient tolerated the procedure well and was taken to the postanesthesia care unit in stable condition.    Specimen: Right Breast mass                       Complications: None  Estimated Blood Loss: 5 mL

## 2022-10-09 ENCOUNTER — Other Ambulatory Visit: Payer: Self-pay | Admitting: Pathology

## 2022-10-09 LAB — SURGICAL PATHOLOGY

## 2022-10-12 ENCOUNTER — Encounter: Payer: Self-pay | Admitting: Licensed Clinical Social Worker

## 2022-10-12 ENCOUNTER — Telehealth: Payer: Self-pay | Admitting: Licensed Clinical Social Worker

## 2022-10-12 ENCOUNTER — Ambulatory Visit: Payer: Self-pay | Admitting: Licensed Clinical Social Worker

## 2022-10-12 DIAGNOSIS — Z1379 Encounter for other screening for genetic and chromosomal anomalies: Secondary | ICD-10-CM

## 2022-10-12 NOTE — Progress Notes (Signed)
HPI:   Ms. Nobel was previously seen in the [facility] due to a personal and family history of breast cancer and concerns regarding a hereditary predisposition to cancer. Please refer to our prior cancer genetics clinic note for more information regarding our discussion, assessment and recommendations, at the time. Ms. Braegger recent genetic test results were disclosed to her, as were recommendations warranted by these results. These results and recommendations are discussed in more detail below.  CANCER HISTORY:  Oncology History   No history exists.    FAMILY HISTORY:  We obtained a detailed, 4-generation family history.  Significant diagnoses are listed below: Family History  Problem Relation Age of Onset   Breast cancer Sister        early 21's   Pneumonia Mother    Pneumonia Father     Ms. Schlaefer has 1 daughter, 53, and 1 son, 36. She has 3 sisters and 1 brother. One sister, Bonita Quin, had breast cancer at 26. 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    Ms. Jaskolka mother passed at 31-75 and there is no known cancer on this side of the family.   Ms. Mages father passed at 42. Paternal grandmother had lung cancer and died over age 87.   Ms. Gemmill is unaware of previous family history of genetic testing for hereditary cancer risks. There is no reported Ashkenazi Jewish ancestry. The is no known consanguinity.     GENETIC TEST RESULTS:  The [facility] Common Hereditary Cancers+RNA Panel found no pathogenic mutations.   The Common Hereditary Cancers Panel + RNA offered by [facility] includes sequencing and/or deletion duplication testing of the following 48 genes: APC*, ATM*, AXIN2, BAP1, BARD1, BMPR1A, BRCA1, BRCA2, BRIP1, CDH1, CDK4, CDKN2A (p14ARF), CDKN2A (p16INK4a), CHEK2, CTNNA1, DICER1*, EPCAM*, FH*, GREM1*, HOXB13, KIT, MBD4, MEN1*, MLH1*, MSH2*, MSH3*, MSH6*, MUTYH,  NF1*, NTHL1, PALB2, PDGFRA, PMS2*, POLD1*, POLE, PTEN*, RAD51C, RAD51D, SDHA*, SDHB, SDHC*, SDHD, SMAD4, SMARCA4, STK11, TP53, TSC1*, TSC2, VHL.   The test report has been scanned into EPIC and is located under the Molecular Pathology section of the Results Review tab.  A portion of the result report is included below for reference. Genetic testing reported out on 10/10/2022.     Even though a pathogenic variant was not identified, possible explanations for the cancer in the family may include: There may be no hereditary risk for cancer in the family. The cancers in Ms. Rosenwinkel and/or her family may be sporadic/familial or due to other genetic and environmental factors. There may be a gene mutation in one of these genes that current testing methods cannot detect but that chance is small. There could be another gene that has not yet been discovered, or that we have not yet tested, that is responsible for the cancer diagnoses in the family.  It is also possible there is a hereditary cause for the cancer in the family that Ms. Bingman did not inherit. Therefore, it is important to remain in touch with cancer genetics in the future so that we can continue to offer Ms. Rasnake the most up to date genetic testing.   ADDITIONAL GENETIC TESTING:  We discussed with Ms. Resurreccion that her genetic testing was fairly extensive.  If there are additional relevant genes identified to increase cancer risk that can be analyzed in the future, we would be happy to discuss and coordinate this testing at that  time.    CANCER SCREENING RECOMMENDATIONS:  Ms. Melnik test result is considered negative (normal).  This means that we have not identified a hereditary cause for her personal and family history of cancer at this time.   An individual's cancer risk and medical management are not determined by genetic test results alone. Overall cancer risk assessment incorporates additional factors, including personal medical  history, family history, and any available genetic information that may result in a personalized plan for cancer prevention and surveillance. Therefore, it is recommended she continue to follow the cancer management and screening guidelines provided by her oncology and primary healthcare provider.  RECOMMENDATIONS FOR FAMILY MEMBERS:   Since she did not inherit a identifiable mutation in a cancer predisposition gene included on this panel, her children could not have inherited a known mutation from her in one of these genes. Individuals in this family might be at some increased risk of developing cancer, over the general population risk, due to the family history of cancer.  Individuals in the family should notify their providers of the family history of cancer. We recommend women in this family have a yearly mammogram beginning at age 44, or 13 years younger than the earliest onset of cancer, an annual clinical breast exam, and perform monthly breast self-exams.  Family members should have colonoscopies by at age 62, or earlier, as recommended by their providers. Other members of the family may still carry a pathogenic variant in one of these genes that Ms. Haag did not inherit. Based on the family history, we recommend her sister, who was diagnosed with breast cancer at age 79, have genetic counseling and updated testing. Ms. Holloman will let us know if we can be of any assistance in coordinating genetic counseling and/or testing for this family member.     FOLLOW-UP:  Lastly, we discussed with Ms. Mcaskill that cancer genetics is a rapidly advancing field and it is possible that new genetic tests will be appropriate for her and/or her family members in the future. We encouraged her to remain in contact with cancer genetics on an annual basis so we can update her personal and family histories and let her know of advances in cancer genetics that may benefit this family.   Our contact number was  provided. Ms. Kanitz questions were answered to her satisfaction, and she knows she is welcome to call us at anytime with additional questions or concerns.    Lacy Duverney, MS, [facility] Genetic Counselor [email or web address] Phone: [phone]

## 2022-10-12 NOTE — Telephone Encounter (Signed)
I contacted Ms. Qasem to discuss her genetic testing results. No pathogenic variants were identified in the 48 genes analyzed. Detailed clinic note to follow.   The test report has been scanned into EPIC and is located under the Molecular Pathology section of the Results Review tab.  A portion of the result report is included below for reference.      Lacy Duverney, MS, [facility] Genetic Counselor [email or web address] Phone: [phone]

## 2022-10-21 ENCOUNTER — Encounter: Payer: Self-pay | Admitting: Oncology

## 2022-10-21 ENCOUNTER — Ambulatory Visit
Admission: RE | Admit: 2022-10-21 | Discharge: 2022-10-21 | Disposition: A | Discharge status: Home / Self Care | Payer: Medicare PPO | Source: Ambulatory Visit | Attending: Radiation Oncology | Admitting: Radiation Oncology

## 2022-10-21 ENCOUNTER — Inpatient Hospital Stay: Payer: Medicare PPO | Admitting: Oncology

## 2022-10-21 VITALS — BP 114/78 | HR 80 | Temp 97.0°F | Resp 17 | Ht 63.0 in | Wt 149.0 lb

## 2022-10-21 DIAGNOSIS — Z79899 Other long term (current) drug therapy: Secondary | ICD-10-CM | POA: Diagnosis not present

## 2022-10-21 DIAGNOSIS — Z803 Family history of malignant neoplasm of breast: Secondary | ICD-10-CM | POA: Diagnosis not present

## 2022-10-21 DIAGNOSIS — Z170 Estrogen receptor positive status [ER+]: Secondary | ICD-10-CM | POA: Diagnosis not present

## 2022-10-21 DIAGNOSIS — I739 Peripheral vascular disease, unspecified: Secondary | ICD-10-CM | POA: Diagnosis not present

## 2022-10-21 DIAGNOSIS — E039 Hypothyroidism, unspecified: Secondary | ICD-10-CM | POA: Insufficient documentation

## 2022-10-21 DIAGNOSIS — E7800 Pure hypercholesterolemia, unspecified: Secondary | ICD-10-CM | POA: Insufficient documentation

## 2022-10-21 DIAGNOSIS — Z510 Encounter for antineoplastic radiation therapy: Secondary | ICD-10-CM | POA: Diagnosis present

## 2022-10-21 DIAGNOSIS — M129 Arthropathy, unspecified: Secondary | ICD-10-CM | POA: Insufficient documentation

## 2022-10-21 DIAGNOSIS — Z7189 Other specified counseling: Secondary | ICD-10-CM | POA: Diagnosis not present

## 2022-10-21 DIAGNOSIS — Z7982 Long term (current) use of aspirin: Secondary | ICD-10-CM | POA: Diagnosis not present

## 2022-10-21 DIAGNOSIS — Z79890 Hormone replacement therapy (postmenopausal): Secondary | ICD-10-CM | POA: Insufficient documentation

## 2022-10-21 DIAGNOSIS — D0511 Intraductal carcinoma in situ of right breast: Secondary | ICD-10-CM

## 2022-10-21 DIAGNOSIS — H8109 Meniere's disease, unspecified ear: Secondary | ICD-10-CM | POA: Insufficient documentation

## 2022-10-21 DIAGNOSIS — I6529 Occlusion and stenosis of unspecified carotid artery: Secondary | ICD-10-CM | POA: Diagnosis not present

## 2022-10-21 DIAGNOSIS — Z87891 Personal history of nicotine dependence: Secondary | ICD-10-CM | POA: Insufficient documentation

## 2022-10-21 MED ORDER — INFLUENZA VAC A&B SURF ANT ADJ 0.5 ML IM SUSY: 0.5000 mL | PREFILLED_SYRINGE | Freq: Once | INTRAMUSCULAR | Status: DC

## 2022-10-21 NOTE — Progress Notes (Signed)
Patient here for oncology follow-up appointment, concerns of slight breast pain from surgical site

## 2022-10-21 NOTE — Progress Notes (Signed)
Hematology/Oncology Consult note [facility]  Telephone:([phone] [phone]  Patient Care Team: Marisue Ivan, MD as PCP - General (Family Medicine) Hulen Luster, RN as Oncology Nurse Navigator Carmina Miller, MD as Consulting Physician (Radiation Oncology)   Name of the patient: Lori Kidd  [id number]  February 17, 1950   Date of visit: 10/21/22  Diagnosis-right breast DCIS ER positive  Chief complaint/ Reason for visit-discuss pathology results and further management  Heme/Onc history: Patient is a 72 year old female who underwent a routine screening mammogram in August 2024 which showed possible calcifications in the right breast. This was followed by diagnostic mammogram which showed 0.9 cm group of pleomorphic calcifications in the right breast outer quadrant. 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   Patient underwent lumpectomy on 10/07/2022.  Final pathology showed 9 mm ER positive DCIS intermediate grade with comedonecrosis and negative margins of 3 mm.  Interval history-patient is recovering well from her lumpectomy and denies any specific complaints at this time  ECOG PS- 0 Pain scale- 0   Review of systems- Review of Systems  Constitutional:  Negative for chills, fever, malaise/fatigue and weight loss.  HENT:  Negative for congestion, ear discharge and nosebleeds.   Eyes:  Negative for blurred vision.  Respiratory:  Negative for cough, hemoptysis, sputum production, shortness of breath and wheezing.   Cardiovascular:  Negative for chest pain, palpitations, orthopnea and claudication.  Gastrointestinal:  Negative for abdominal pain, blood in stool, constipation, diarrhea, heartburn, melena, nausea and vomiting.  Genitourinary:  Negative for dysuria, flank pain, frequency, hematuria and urgency.  Musculoskeletal:  Negative for  back pain, joint pain and myalgias.  Skin:  Negative for rash.  Neurological:  Negative for dizziness, tingling, focal weakness, seizures, weakness and headaches.  Endo/Heme/Allergies:  Does not bruise/bleed easily.  Psychiatric/Behavioral:  Negative for depression and suicidal ideas. The patient does not have insomnia.       Allergies  Allergen Reactions   Ace Inhibitors Cough   Penicillins Rash and Other (See Comments)    Redness Has patient had a PCN reaction causing immediate rash, facial/tongue/throat swelling, SOB or lightheadedness with hypotension: Yes Has patient had a PCN reaction causing severe rash involving mucus membranes or skin necrosis: No Has patient had a PCN reaction that required hospitalization: No Has patient had a PCN reaction occurring within the last 10 years: No If all of the above answers are "NO", then may proceed with Cephalosporin use.      Past Medical History:  Diagnosis Date   Arthritis    Asthma    Cancer (HCC)    Carotid stenosis    High blood pressure    High cholesterol    Hypothyroidism    Meniere disease    PAD (peripheral artery disease) (HCC)    Pneumonia    S/P TKR (total knee replacement), right      Past Surgical History:  Procedure Laterality Date   APPENDECTOMY  1982   BREAST BIOPSY Right 09/22/2022   stereo bx, x-clip, path pending   BREAST BIOPSY Right 09/22/2022   MM RT BREAST BX W LOC DEV 1ST LESION IMAGE BX SPEC STEREO GUIDE 09/22/2022 [facility]   BREAST BIOPSY Right 10/01/2022   MM RT RADIO FREQUENCY TAG LOC MAMMO GUIDE 10/01/2022 [facility]   CATARACT EXTRACTION W/PHACO Right 03/17/2021   Procedure: CATARACT EXTRACTION PHACO AND INTRAOCULAR LENS PLACEMENT (IOC) RIGHT;  Surgeon: Willey Blade  a BMD test on 10/06/2022 using the [employer] DXA System (software version: 14.10) manufactured by [employer]. The following summarizes the results of our evaluation. Technologist: [facility] PATIENT BIOGRAPHICAL: Name: Lori Kidd, Lori Kidd Patient ID:  [id number] Birth Date: Jun 21, 1950 Height:     63.0 in. Gender:      Female Exam Date:  10/06/2022 Weight:     147.9 lbs. Indications: Breast CA, Caucasian, Hypothyroid, Hysterectomy, Oophorectomy Unilateral, Postmenopausal Fractures: Treatments: Levothyroxine, Vitamin D DENSITOMETRY RESULTS: Site         Region      Measured Date Measured Age WHO Classification Young Adult T-score BMD         %Change vs. Previous Significant Change (*) DualFemur Total Right 10/06/2022 69.8 Normal -0.8 0.910 g/cm2 Left Forearm Radius 33% 10/06/2022 69.8 Normal -0.5 0.835 g/cm2 ASSESSMENT: The BMD measured at Femur Total Right is 0.910 g/cm2 with a T-score of -0.8. This patient is considered normal according to World Health Organization [facility]) criteria. Lumbar spine was not utilized due to scoliosis. The scan quality is good. World Science writer [facility]) criteria for post-menopausal, Caucasian Women: Normal:                   T-score at or above -1 SD Osteopenia/low bone mass: T-score between -1 and -2.5 SD Osteoporosis:             T-score at or below -2.5 SD RECOMMENDATIONS: 1. All patients should optimize calcium and vitamin D intake. 2. Consider FDA-approved medical therapies in postmenopausal women and men aged 50 years and older, based on the following: a. A hip or vertebral(clinical or morphometric) fracture b. T-score < -2.5 at the femoral neck or spine after appropriate evaluation to exclude secondary causes c. Low bone mass (T-score between -1.0 and -2.5 at the femoral neck or spine) and a 10-year probability of a hip fracture > 3% or a 10-year probability of a major  osteoporosis-related fracture > 20% based on the [place]-adapted WHO algorithm 3. Clinician judgment and/or patient preferences may indicate treatment for people with 10-year fracture probabilities above or below these levels FOLLOW-UP: People with diagnosed cases of osteoporosis or at high risk for fracture should have regular bone mineral density tests. For patients eligible for Medicare, routine testing is allowed once every 2 years. The testing frequency can be increased to one year for patients who have rapidly progressing disease, those who are receiving or discontinuing medical therapy to restore bone mass, or have additional risk factors. I have reviewed this report, and agree with the above findings. [facility] Electronically Signed   By: Frederico Hamman M.D.   On: 10/06/2022 13:06   MM RT RADIO FREQUENCY TAG LOC MAMMO GUIDE  Result Date: 10/01/2022 CLINICAL DATA:  72 year old female presenting for SAVI scout localization. Patient has newly diagnosed right breast DCIS. EXAM: NEEDLE LOCALIZATION OF THE RIGHT BREAST WITH MAMMO GUIDANCE COMPARISON:  Previous exam(s). FINDINGS: Patient presents for needle localization prior to right breast lumpectomy. I met with the patient and we discussed the procedure of needle localization including benefits and alternatives. We discussed the high likelihood of a successful procedure. We discussed the risks of the procedure, including infection, bleeding, tissue injury, and further surgery. Informed, written consent was given. The usual time-out protocol was performed immediately prior to the procedure. Using mammographic guidance, sterile technique, 1% lidocaine and a 7 cm SAVI SCOUT needle, the X biopsy marking clip in the right breast was localized using a lateral approach. Reflector  a BMD test on 10/06/2022 using the [employer] DXA System (software version: 14.10) manufactured by [employer]. The following summarizes the results of our evaluation. Technologist: [facility] PATIENT BIOGRAPHICAL: Name: Lori Kidd, Lori Kidd Patient ID:  [id number] Birth Date: Jun 21, 1950 Height:     63.0 in. Gender:      Female Exam Date:  10/06/2022 Weight:     147.9 lbs. Indications: Breast CA, Caucasian, Hypothyroid, Hysterectomy, Oophorectomy Unilateral, Postmenopausal Fractures: Treatments: Levothyroxine, Vitamin D DENSITOMETRY RESULTS: Site         Region      Measured Date Measured Age WHO Classification Young Adult T-score BMD         %Change vs. Previous Significant Change (*) DualFemur Total Right 10/06/2022 69.8 Normal -0.8 0.910 g/cm2 Left Forearm Radius 33% 10/06/2022 69.8 Normal -0.5 0.835 g/cm2 ASSESSMENT: The BMD measured at Femur Total Right is 0.910 g/cm2 with a T-score of -0.8. This patient is considered normal according to World Health Organization [facility]) criteria. Lumbar spine was not utilized due to scoliosis. The scan quality is good. World Science writer [facility]) criteria for post-menopausal, Caucasian Women: Normal:                   T-score at or above -1 SD Osteopenia/low bone mass: T-score between -1 and -2.5 SD Osteoporosis:             T-score at or below -2.5 SD RECOMMENDATIONS: 1. All patients should optimize calcium and vitamin D intake. 2. Consider FDA-approved medical therapies in postmenopausal women and men aged 50 years and older, based on the following: a. A hip or vertebral(clinical or morphometric) fracture b. T-score < -2.5 at the femoral neck or spine after appropriate evaluation to exclude secondary causes c. Low bone mass (T-score between -1.0 and -2.5 at the femoral neck or spine) and a 10-year probability of a hip fracture > 3% or a 10-year probability of a major  osteoporosis-related fracture > 20% based on the [place]-adapted WHO algorithm 3. Clinician judgment and/or patient preferences may indicate treatment for people with 10-year fracture probabilities above or below these levels FOLLOW-UP: People with diagnosed cases of osteoporosis or at high risk for fracture should have regular bone mineral density tests. For patients eligible for Medicare, routine testing is allowed once every 2 years. The testing frequency can be increased to one year for patients who have rapidly progressing disease, those who are receiving or discontinuing medical therapy to restore bone mass, or have additional risk factors. I have reviewed this report, and agree with the above findings. [facility] Electronically Signed   By: Frederico Hamman M.D.   On: 10/06/2022 13:06   MM RT RADIO FREQUENCY TAG LOC MAMMO GUIDE  Result Date: 10/01/2022 CLINICAL DATA:  72 year old female presenting for SAVI scout localization. Patient has newly diagnosed right breast DCIS. EXAM: NEEDLE LOCALIZATION OF THE RIGHT BREAST WITH MAMMO GUIDANCE COMPARISON:  Previous exam(s). FINDINGS: Patient presents for needle localization prior to right breast lumpectomy. I met with the patient and we discussed the procedure of needle localization including benefits and alternatives. We discussed the high likelihood of a successful procedure. We discussed the risks of the procedure, including infection, bleeding, tissue injury, and further surgery. Informed, written consent was given. The usual time-out protocol was performed immediately prior to the procedure. Using mammographic guidance, sterile technique, 1% lidocaine and a 7 cm SAVI SCOUT needle, the X biopsy marking clip in the right breast was localized using a lateral approach. Reflector  Hematology/Oncology Consult note [facility]  Telephone:([phone] [phone]  Patient Care Team: Marisue Ivan, MD as PCP - General (Family Medicine) Hulen Luster, RN as Oncology Nurse Navigator Carmina Miller, MD as Consulting Physician (Radiation Oncology)   Name of the patient: Lori Kidd  [id number]  February 17, 1950   Date of visit: 10/21/22  Diagnosis-right breast DCIS ER positive  Chief complaint/ Reason for visit-discuss pathology results and further management  Heme/Onc history: Patient is a 72 year old female who underwent a routine screening mammogram in August 2024 which showed possible calcifications in the right breast. This was followed by diagnostic mammogram which showed 0.9 cm group of pleomorphic calcifications in the right breast outer quadrant. 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   Patient underwent lumpectomy on 10/07/2022.  Final pathology showed 9 mm ER positive DCIS intermediate grade with comedonecrosis and negative margins of 3 mm.  Interval history-patient is recovering well from her lumpectomy and denies any specific complaints at this time  ECOG PS- 0 Pain scale- 0   Review of systems- Review of Systems  Constitutional:  Negative for chills, fever, malaise/fatigue and weight loss.  HENT:  Negative for congestion, ear discharge and nosebleeds.   Eyes:  Negative for blurred vision.  Respiratory:  Negative for cough, hemoptysis, sputum production, shortness of breath and wheezing.   Cardiovascular:  Negative for chest pain, palpitations, orthopnea and claudication.  Gastrointestinal:  Negative for abdominal pain, blood in stool, constipation, diarrhea, heartburn, melena, nausea and vomiting.  Genitourinary:  Negative for dysuria, flank pain, frequency, hematuria and urgency.  Musculoskeletal:  Negative for  back pain, joint pain and myalgias.  Skin:  Negative for rash.  Neurological:  Negative for dizziness, tingling, focal weakness, seizures, weakness and headaches.  Endo/Heme/Allergies:  Does not bruise/bleed easily.  Psychiatric/Behavioral:  Negative for depression and suicidal ideas. The patient does not have insomnia.       Allergies  Allergen Reactions   Ace Inhibitors Cough   Penicillins Rash and Other (See Comments)    Redness Has patient had a PCN reaction causing immediate rash, facial/tongue/throat swelling, SOB or lightheadedness with hypotension: Yes Has patient had a PCN reaction causing severe rash involving mucus membranes or skin necrosis: No Has patient had a PCN reaction that required hospitalization: No Has patient had a PCN reaction occurring within the last 10 years: No If all of the above answers are "NO", then may proceed with Cephalosporin use.      Past Medical History:  Diagnosis Date   Arthritis    Asthma    Cancer (HCC)    Carotid stenosis    High blood pressure    High cholesterol    Hypothyroidism    Meniere disease    PAD (peripheral artery disease) (HCC)    Pneumonia    S/P TKR (total knee replacement), right      Past Surgical History:  Procedure Laterality Date   APPENDECTOMY  1982   BREAST BIOPSY Right 09/22/2022   stereo bx, x-clip, path pending   BREAST BIOPSY Right 09/22/2022   MM RT BREAST BX W LOC DEV 1ST LESION IMAGE BX SPEC STEREO GUIDE 09/22/2022 [facility]   BREAST BIOPSY Right 10/01/2022   MM RT RADIO FREQUENCY TAG LOC MAMMO GUIDE 10/01/2022 [facility]   CATARACT EXTRACTION W/PHACO Right 03/17/2021   Procedure: CATARACT EXTRACTION PHACO AND INTRAOCULAR LENS PLACEMENT (IOC) RIGHT;  Surgeon: Willey Blade  a BMD test on 10/06/2022 using the [employer] DXA System (software version: 14.10) manufactured by [employer]. The following summarizes the results of our evaluation. Technologist: [facility] PATIENT BIOGRAPHICAL: Name: Lori Kidd, Lori Kidd Patient ID:  [id number] Birth Date: Jun 21, 1950 Height:     63.0 in. Gender:      Female Exam Date:  10/06/2022 Weight:     147.9 lbs. Indications: Breast CA, Caucasian, Hypothyroid, Hysterectomy, Oophorectomy Unilateral, Postmenopausal Fractures: Treatments: Levothyroxine, Vitamin D DENSITOMETRY RESULTS: Site         Region      Measured Date Measured Age WHO Classification Young Adult T-score BMD         %Change vs. Previous Significant Change (*) DualFemur Total Right 10/06/2022 69.8 Normal -0.8 0.910 g/cm2 Left Forearm Radius 33% 10/06/2022 69.8 Normal -0.5 0.835 g/cm2 ASSESSMENT: The BMD measured at Femur Total Right is 0.910 g/cm2 with a T-score of -0.8. This patient is considered normal according to World Health Organization [facility]) criteria. Lumbar spine was not utilized due to scoliosis. The scan quality is good. World Science writer [facility]) criteria for post-menopausal, Caucasian Women: Normal:                   T-score at or above -1 SD Osteopenia/low bone mass: T-score between -1 and -2.5 SD Osteoporosis:             T-score at or below -2.5 SD RECOMMENDATIONS: 1. All patients should optimize calcium and vitamin D intake. 2. Consider FDA-approved medical therapies in postmenopausal women and men aged 50 years and older, based on the following: a. A hip or vertebral(clinical or morphometric) fracture b. T-score < -2.5 at the femoral neck or spine after appropriate evaluation to exclude secondary causes c. Low bone mass (T-score between -1.0 and -2.5 at the femoral neck or spine) and a 10-year probability of a hip fracture > 3% or a 10-year probability of a major  osteoporosis-related fracture > 20% based on the [place]-adapted WHO algorithm 3. Clinician judgment and/or patient preferences may indicate treatment for people with 10-year fracture probabilities above or below these levels FOLLOW-UP: People with diagnosed cases of osteoporosis or at high risk for fracture should have regular bone mineral density tests. For patients eligible for Medicare, routine testing is allowed once every 2 years. The testing frequency can be increased to one year for patients who have rapidly progressing disease, those who are receiving or discontinuing medical therapy to restore bone mass, or have additional risk factors. I have reviewed this report, and agree with the above findings. [facility] Electronically Signed   By: Frederico Hamman M.D.   On: 10/06/2022 13:06   MM RT RADIO FREQUENCY TAG LOC MAMMO GUIDE  Result Date: 10/01/2022 CLINICAL DATA:  72 year old female presenting for SAVI scout localization. Patient has newly diagnosed right breast DCIS. EXAM: NEEDLE LOCALIZATION OF THE RIGHT BREAST WITH MAMMO GUIDANCE COMPARISON:  Previous exam(s). FINDINGS: Patient presents for needle localization prior to right breast lumpectomy. I met with the patient and we discussed the procedure of needle localization including benefits and alternatives. We discussed the high likelihood of a successful procedure. We discussed the risks of the procedure, including infection, bleeding, tissue injury, and further surgery. Informed, written consent was given. The usual time-out protocol was performed immediately prior to the procedure. Using mammographic guidance, sterile technique, 1% lidocaine and a 7 cm SAVI SCOUT needle, the X biopsy marking clip in the right breast was localized using a lateral approach. Reflector  a BMD test on 10/06/2022 using the [employer] DXA System (software version: 14.10) manufactured by [employer]. The following summarizes the results of our evaluation. Technologist: [facility] PATIENT BIOGRAPHICAL: Name: Lori Kidd, Lori Kidd Patient ID:  [id number] Birth Date: Jun 21, 1950 Height:     63.0 in. Gender:      Female Exam Date:  10/06/2022 Weight:     147.9 lbs. Indications: Breast CA, Caucasian, Hypothyroid, Hysterectomy, Oophorectomy Unilateral, Postmenopausal Fractures: Treatments: Levothyroxine, Vitamin D DENSITOMETRY RESULTS: Site         Region      Measured Date Measured Age WHO Classification Young Adult T-score BMD         %Change vs. Previous Significant Change (*) DualFemur Total Right 10/06/2022 69.8 Normal -0.8 0.910 g/cm2 Left Forearm Radius 33% 10/06/2022 69.8 Normal -0.5 0.835 g/cm2 ASSESSMENT: The BMD measured at Femur Total Right is 0.910 g/cm2 with a T-score of -0.8. This patient is considered normal according to World Health Organization [facility]) criteria. Lumbar spine was not utilized due to scoliosis. The scan quality is good. World Science writer [facility]) criteria for post-menopausal, Caucasian Women: Normal:                   T-score at or above -1 SD Osteopenia/low bone mass: T-score between -1 and -2.5 SD Osteoporosis:             T-score at or below -2.5 SD RECOMMENDATIONS: 1. All patients should optimize calcium and vitamin D intake. 2. Consider FDA-approved medical therapies in postmenopausal women and men aged 50 years and older, based on the following: a. A hip or vertebral(clinical or morphometric) fracture b. T-score < -2.5 at the femoral neck or spine after appropriate evaluation to exclude secondary causes c. Low bone mass (T-score between -1.0 and -2.5 at the femoral neck or spine) and a 10-year probability of a hip fracture > 3% or a 10-year probability of a major  osteoporosis-related fracture > 20% based on the [place]-adapted WHO algorithm 3. Clinician judgment and/or patient preferences may indicate treatment for people with 10-year fracture probabilities above or below these levels FOLLOW-UP: People with diagnosed cases of osteoporosis or at high risk for fracture should have regular bone mineral density tests. For patients eligible for Medicare, routine testing is allowed once every 2 years. The testing frequency can be increased to one year for patients who have rapidly progressing disease, those who are receiving or discontinuing medical therapy to restore bone mass, or have additional risk factors. I have reviewed this report, and agree with the above findings. [facility] Electronically Signed   By: Frederico Hamman M.D.   On: 10/06/2022 13:06   MM RT RADIO FREQUENCY TAG LOC MAMMO GUIDE  Result Date: 10/01/2022 CLINICAL DATA:  72 year old female presenting for SAVI scout localization. Patient has newly diagnosed right breast DCIS. EXAM: NEEDLE LOCALIZATION OF THE RIGHT BREAST WITH MAMMO GUIDANCE COMPARISON:  Previous exam(s). FINDINGS: Patient presents for needle localization prior to right breast lumpectomy. I met with the patient and we discussed the procedure of needle localization including benefits and alternatives. We discussed the high likelihood of a successful procedure. We discussed the risks of the procedure, including infection, bleeding, tissue injury, and further surgery. Informed, written consent was given. The usual time-out protocol was performed immediately prior to the procedure. Using mammographic guidance, sterile technique, 1% lidocaine and a 7 cm SAVI SCOUT needle, the X biopsy marking clip in the right breast was localized using a lateral approach. Reflector

## 2022-10-21 NOTE — Consult Note (Signed)
NEW PATIENT EVALUATION  Name: Lori Kidd  MRN: [id number]  Date:   10/21/2022     DOB: 1950/07/16   This 72 y.o. 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  REFERRING PHYSICIAN: Marisue Ivan, MD  CHIEF COMPLAINT: No chief complaint on file.   DIAGNOSIS: The encounter diagnosis was Intraductal carcinoma in situ of right breast.   PREVIOUS INVESTIGATIONS: Mammogram and ultrasound reviewed Clinical notes reviewed Surgical pathology reviewed  HPI: Patient is a 72 year old female who presented with new calcifications in the right breast which warranted further evaluation.  There was a 0.9 cm group of P more thick calcifications in the slightly outer right breast mid depth.  She underwent guided biopsy which was positive for ductal carcinoma in situ ER positive.  Tumor was intermediate grade with comedonecrosis and calcifications.  She then underwent a wide local excision again showing ER positive ductal carcinoma in situ measuring 9 mm grade 2 with necrosis present margins were clear at 3 mm.  She is done well postoperatively.  Is seen today for consideration of adjuvant radiation therapy.  PLANNED TREATMENT REGIMEN: Partial breast radiation  PAST MEDICAL HISTORY:  has a past medical history of Arthritis, Asthma, Cancer (HCC), Carotid stenosis, High blood pressure, High cholesterol, Hypothyroidism, Meniere disease, PAD (peripheral artery disease) (HCC), Pneumonia, and S/P TKR (total knee replacement), right.    PAST SURGICAL HISTORY:  Past Surgical History:  Procedure Laterality Date   APPENDECTOMY  1982   BREAST BIOPSY Right 09/22/2022   stereo bx, x-clip, path pending   BREAST BIOPSY Right 09/22/2022   MM RT BREAST BX W LOC DEV 1ST LESION IMAGE BX SPEC STEREO GUIDE 09/22/2022 [facility]   BREAST BIOPSY Right 10/01/2022   MM RT RADIO  FREQUENCY TAG LOC MAMMO GUIDE 10/01/2022 [facility]   CATARACT EXTRACTION W/PHACO Right 03/17/2021   Procedure: CATARACT EXTRACTION PHACO AND INTRAOCULAR LENS PLACEMENT (IOC) RIGHT;  Surgeon: Nevada Crane, MD;  Location: [facility];  Service: Ophthalmology;  Laterality: Right;  4.96 00:35.7   COLONOSCOPY     Q5YRS SINCE 1998   COLONOSCOPY WITH PROPOFOL N/A 01/29/2021   Procedure: COLONOSCOPY WITH PROPOFOL;  Surgeon: Toledo, Boykin Nearing, MD;  Location: [facility];  Service: Gastroenterology;  Laterality: N/A;   KNEE ARTHROPLASTY Right 01/24/2018   Procedure: COMPUTER ASSISTED TOTAL KNEE ARTHROPLASTY;  Surgeon: Donato Heinz, MD;  Location: [facility];  Service: Orthopedics;  Laterality: Right;   SIGMOID RESECTION / RECTOPEXY  1998   TONSILLECTOMY  1962   TUBAL LIGATION  1983   VAGINAL HYSTERECTOMY  2000    FAMILY HISTORY: family history includes Breast cancer in her sister; Pneumonia in her father and mother.  SOCIAL HISTORY:  reports that she has quit smoking. Her smoking use included cigarettes. She has never used smokeless tobacco. She reports current alcohol use of about 3.0 standard drinks of alcohol per week. She reports that she does not use drugs.  ALLERGIES: Ace inhibitors and Penicillins  MEDICATIONS:  Current Outpatient Medications  Medication Sig Dispense Refill   Acetaminophen 500 MG capsule Take by mouth every 6 (six) hours as needed for fever.     albuterol (PROVENTIL HFA;VENTOLIN HFA) 108 (90 Base) MCG/ACT inhaler Inhale 2 puffs into the lungs every 6 (six) hours as needed for wheezing or shortness of breath.     aspirin EC 81 MG tablet Take 81  mg by mouth daily. Swallow whole.     Cholecalciferol 25 MCG (1000 UT) capsule Take by mouth.     cyanocobalamin (VITAMIN B12) 1000 MCG tablet Take 1,000 mcg by mouth daily.     EPINEPHrine 0.3 mg/0.3 mL IJ SOAJ injection Inject 0.3 mg into the muscle once.     fluticasone (FLONASE) 50 MCG/ACT nasal spray  Place 1 spray into both nostrils daily.     levothyroxine (SYNTHROID, LEVOTHROID) 112 MCG tablet Take 112 mcg by mouth daily before breakfast.      loratadine (CLARITIN) 10 MG tablet Take 10 mg by mouth daily as needed for allergies.     meclizine (ANTIVERT) 25 MG tablet Take 25 mg by mouth 3 (three) times daily as needed for dizziness.     naproxen sodium (ALEVE) 220 MG tablet Take 220 mg by mouth daily as needed.     Omega-3 1000 MG CAPS Take 2,000 mg by mouth daily.      pravastatin (PRAVACHOL) 40 MG tablet Take 1 tablet by mouth at bedtime.     traMADol (ULTRAM) 50 MG tablet Take 1 tablet (50 mg total) by mouth every 6 (six) hours as needed. 20 tablet 0   tretinoin (RETIN-A) 0.025 % cream SMARTSIG:sparingly Topical Every Evening     triamterene-hydrochlorothiazide (MAXZIDE-25) 37.5-25 MG tablet Take 1 tablet by mouth daily.     No current facility-administered medications for this encounter.    ECOG PERFORMANCE STATUS:  0 - Asymptomatic  REVIEW OF SYSTEMS: Patient denies any weight loss, fatigue, weakness, fever, chills or night sweats. Patient denies any loss of vision, blurred vision. Patient denies any ringing  of the ears or hearing loss. No irregular heartbeat. Patient denies heart murmur or history of fainting. Patient denies any chest pain or pain radiating to her upper extremities. Patient denies any shortness of breath, difficulty breathing at night, cough or hemoptysis. Patient denies any swelling in the lower legs. Patient denies any nausea vomiting, vomiting of blood, or coffee ground material in the vomitus. Patient denies any stomach pain. Patient states has had normal bowel movements no significant constipation or diarrhea. Patient denies any dysuria, hematuria or significant nocturia. Patient denies any problems walking, swelling in the joints or loss of balance. Patient denies any skin changes, loss of hair or loss of weight. Patient denies any excessive worrying or anxiety or  significant depression. Patient denies any problems with insomnia. Patient denies excessive thirst, polyuria, polydipsia. Patient denies any swollen glands, patient denies easy bruising or easy bleeding. Patient denies any recent infections, allergies or URI. Patient "s visual fields have not changed significantly in recent time.   PHYSICAL EXAM: There were no vitals taken for this visit. Patient is a wide local excision scar which is healing well no dominant masses noted in either breast.  Breasts are large and pendulous.  No axillary or supraclavicular adenopathy is identified.  Well-developed well-nourished patient in NAD. HEENT reveals PERLA, EOMI, discs not visualized.  Oral cavity is clear. No oral mucosal lesions are identified. Neck is clear without evidence of cervical or supraclavicular adenopathy. Lungs are clear to A&P. Cardiac examination is essentially unremarkable with regular rate and rhythm without murmur rub or thrill. Abdomen is benign with no organomegaly or masses noted. Motor sensory and DTR levels are equal and symmetric in the upper and lower extremities. Cranial nerves II through XII are grossly intact. Proprioception is intact. No peripheral adenopathy or edema is identified. No motor or sensory levels are noted. Crude visual  fields are within normal range.  LABORATORY DATA: Reports reviewed    RADIOLOGY RESULTS: Mammograms ultrasound reviewed compatible with above-stated findings   IMPRESSION: ER positive DCIS of the right breast status post wide local excision and 72 year old female  PLAN: This time of offered based on her large breast size partial breast irradiation.  Would plan on delivering 35 Gray in 10 fractions using IMRT treatment planning and delivery.  Risks and benefits of treatment including possible skin reaction fatigue all were discussed in detail with the patient.  I have personally set up and ordered CT simulation.  Patient also will benefit from endocrine  therapy after completion of radiation.  I would like to take this opportunity to thank you for allowing me to participate in the care of your patient.Carmina Miller, MD

## 2022-10-22 ENCOUNTER — Other Ambulatory Visit: Payer: Self-pay | Admitting: Radiation Oncology

## 2022-10-22 DIAGNOSIS — D0511 Intraductal carcinoma in situ of right breast: Secondary | ICD-10-CM

## 2022-10-23 ENCOUNTER — Encounter: Payer: Self-pay | Admitting: *Deleted

## 2022-10-23 ENCOUNTER — Ambulatory Visit: Admission: RE | Admit: 2022-10-23 | Payer: Medicare PPO | Source: Ambulatory Visit

## 2022-10-23 ENCOUNTER — Ambulatory Visit
Admission: RE | Admit: 2022-10-23 | Discharge: 2022-10-23 | Disposition: A | Discharge status: Home / Self Care | Payer: Medicare PPO | Source: Ambulatory Visit | Attending: Radiation Oncology | Admitting: Radiation Oncology

## 2022-10-23 DIAGNOSIS — D0511 Intraductal carcinoma in situ of right breast: Secondary | ICD-10-CM | POA: Insufficient documentation

## 2022-10-23 DIAGNOSIS — Z510 Encounter for antineoplastic radiation therapy: Secondary | ICD-10-CM | POA: Diagnosis not present

## 2022-11-02 DIAGNOSIS — E039 Hypothyroidism, unspecified: Secondary | ICD-10-CM | POA: Insufficient documentation

## 2022-11-02 DIAGNOSIS — Z510 Encounter for antineoplastic radiation therapy: Secondary | ICD-10-CM | POA: Insufficient documentation

## 2022-11-02 DIAGNOSIS — I6529 Occlusion and stenosis of unspecified carotid artery: Secondary | ICD-10-CM | POA: Diagnosis not present

## 2022-11-02 DIAGNOSIS — Z170 Estrogen receptor positive status [ER+]: Secondary | ICD-10-CM | POA: Diagnosis not present

## 2022-11-02 DIAGNOSIS — Z803 Family history of malignant neoplasm of breast: Secondary | ICD-10-CM | POA: Insufficient documentation

## 2022-11-02 DIAGNOSIS — H8109 Meniere's disease, unspecified ear: Secondary | ICD-10-CM | POA: Diagnosis not present

## 2022-11-02 DIAGNOSIS — Z79899 Other long term (current) drug therapy: Secondary | ICD-10-CM | POA: Diagnosis not present

## 2022-11-02 DIAGNOSIS — M129 Arthropathy, unspecified: Secondary | ICD-10-CM | POA: Insufficient documentation

## 2022-11-02 DIAGNOSIS — I739 Peripheral vascular disease, unspecified: Secondary | ICD-10-CM | POA: Diagnosis not present

## 2022-11-02 DIAGNOSIS — Z79890 Hormone replacement therapy (postmenopausal): Secondary | ICD-10-CM | POA: Diagnosis not present

## 2022-11-02 DIAGNOSIS — Z87891 Personal history of nicotine dependence: Secondary | ICD-10-CM | POA: Diagnosis not present

## 2022-11-02 DIAGNOSIS — D0511 Intraductal carcinoma in situ of right breast: Secondary | ICD-10-CM | POA: Diagnosis not present

## 2022-11-02 DIAGNOSIS — Z7982 Long term (current) use of aspirin: Secondary | ICD-10-CM | POA: Diagnosis not present

## 2022-11-02 DIAGNOSIS — E7800 Pure hypercholesterolemia, unspecified: Secondary | ICD-10-CM | POA: Insufficient documentation

## 2022-11-05 ENCOUNTER — Ambulatory Visit
Admission: RE | Admit: 2022-11-05 | Discharge: 2022-11-05 | Disposition: A | Discharge status: Home / Self Care | Payer: Medicare PPO | Source: Ambulatory Visit | Attending: Radiation Oncology | Admitting: Radiation Oncology

## 2022-11-06 ENCOUNTER — Other Ambulatory Visit: Payer: Self-pay | Admitting: *Deleted

## 2022-11-06 DIAGNOSIS — D0511 Intraductal carcinoma in situ of right breast: Secondary | ICD-10-CM

## 2022-11-09 ENCOUNTER — Other Ambulatory Visit: Payer: Self-pay

## 2022-11-09 ENCOUNTER — Ambulatory Visit
Admission: RE | Admit: 2022-11-09 | Discharge: 2022-11-09 | Disposition: A | Discharge status: Home / Self Care | Payer: Medicare PPO | Source: Ambulatory Visit | Attending: Radiation Oncology | Admitting: Radiation Oncology

## 2022-11-09 DIAGNOSIS — Z510 Encounter for antineoplastic radiation therapy: Secondary | ICD-10-CM | POA: Diagnosis not present

## 2022-11-09 LAB — RAD ONC ARIA SESSION SUMMARY
Course Elapsed Days: 0
Plan Fractions Treated to Date: 1
Plan Prescribed Dose Per Fraction: 3.5 Gy
Plan Total Fractions Prescribed: 10
Plan Total Prescribed Dose: 35 Gy
Reference Point Dosage Given to Date: 3.5 Gy
Reference Point Session Dosage Given: 3.5 Gy
Session Number: 1

## 2022-11-10 ENCOUNTER — Other Ambulatory Visit: Payer: Self-pay

## 2022-11-10 ENCOUNTER — Ambulatory Visit
Admission: RE | Admit: 2022-11-10 | Discharge: 2022-11-10 | Disposition: A | Discharge status: Home / Self Care | Payer: Medicare PPO | Source: Ambulatory Visit | Attending: Radiation Oncology | Admitting: Radiation Oncology

## 2022-11-10 DIAGNOSIS — Z510 Encounter for antineoplastic radiation therapy: Secondary | ICD-10-CM | POA: Diagnosis not present

## 2022-11-10 LAB — RAD ONC ARIA SESSION SUMMARY
Course Elapsed Days: 1
Plan Fractions Treated to Date: 2
Plan Prescribed Dose Per Fraction: 3.5 Gy
Plan Total Fractions Prescribed: 10
Plan Total Prescribed Dose: 35 Gy
Reference Point Dosage Given to Date: 7 Gy
Reference Point Session Dosage Given: 3.5 Gy
Session Number: 2

## 2022-11-11 ENCOUNTER — Ambulatory Visit
Admission: RE | Admit: 2022-11-11 | Discharge: 2022-11-11 | Disposition: A | Discharge status: Home / Self Care | Payer: Medicare PPO | Source: Ambulatory Visit | Attending: Radiation Oncology | Admitting: Radiation Oncology

## 2022-11-11 ENCOUNTER — Other Ambulatory Visit: Payer: Self-pay

## 2022-11-11 ENCOUNTER — Inpatient Hospital Stay: Payer: Medicare PPO | Attending: Oncology

## 2022-11-11 DIAGNOSIS — D0511 Intraductal carcinoma in situ of right breast: Secondary | ICD-10-CM

## 2022-11-11 DIAGNOSIS — Z170 Estrogen receptor positive status [ER+]: Secondary | ICD-10-CM | POA: Insufficient documentation

## 2022-11-11 DIAGNOSIS — Z510 Encounter for antineoplastic radiation therapy: Secondary | ICD-10-CM | POA: Diagnosis not present

## 2022-11-11 LAB — CBC (CANCER CENTER ONLY)
HCT: 43.4 % (ref 36.0–46.0)
Hemoglobin: 14.4 g/dL (ref 12.0–15.0)
MCH: 29.8 pg (ref 26.0–34.0)
MCHC: 33.2 g/dL (ref 30.0–36.0)
MCV: 89.7 fL (ref 80.0–100.0)
Platelet Count: 251 10*3/uL (ref 150–400)
RBC: 4.84 MIL/uL (ref 3.87–5.11)
RDW: 11.9 % (ref 11.5–15.5)
WBC Count: 5.9 10*3/uL (ref 4.0–10.5)
nRBC: 0 % (ref 0.0–0.2)

## 2022-11-11 LAB — RAD ONC ARIA SESSION SUMMARY
Course Elapsed Days: 2
Plan Fractions Treated to Date: 3
Plan Prescribed Dose Per Fraction: 3.5 Gy
Plan Total Fractions Prescribed: 10
Plan Total Prescribed Dose: 35 Gy
Reference Point Dosage Given to Date: 10.5 Gy
Reference Point Session Dosage Given: 3.5 Gy
Session Number: 3

## 2022-11-12 ENCOUNTER — Ambulatory Visit
Admission: RE | Admit: 2022-11-12 | Discharge: 2022-11-12 | Disposition: A | Discharge status: Home / Self Care | Payer: Medicare PPO | Source: Ambulatory Visit | Attending: Radiation Oncology | Admitting: Radiation Oncology

## 2022-11-12 ENCOUNTER — Other Ambulatory Visit: Payer: Self-pay

## 2022-11-12 DIAGNOSIS — Z510 Encounter for antineoplastic radiation therapy: Secondary | ICD-10-CM | POA: Diagnosis not present

## 2022-11-12 LAB — RAD ONC ARIA SESSION SUMMARY
Course Elapsed Days: 3
Plan Fractions Treated to Date: 4
Plan Prescribed Dose Per Fraction: 3.5 Gy
Plan Total Fractions Prescribed: 10
Plan Total Prescribed Dose: 35 Gy
Reference Point Dosage Given to Date: 14 Gy
Reference Point Session Dosage Given: 3.5 Gy
Session Number: 4

## 2022-11-13 ENCOUNTER — Ambulatory Visit
Admission: RE | Admit: 2022-11-13 | Discharge: 2022-11-13 | Disposition: A | Discharge status: Home / Self Care | Payer: Medicare PPO | Source: Ambulatory Visit | Attending: Radiation Oncology | Admitting: Radiation Oncology

## 2022-11-13 ENCOUNTER — Other Ambulatory Visit: Payer: Self-pay

## 2022-11-13 DIAGNOSIS — Z510 Encounter for antineoplastic radiation therapy: Secondary | ICD-10-CM | POA: Diagnosis not present

## 2022-11-13 LAB — RAD ONC ARIA SESSION SUMMARY
Course Elapsed Days: 4
Plan Fractions Treated to Date: 5
Plan Prescribed Dose Per Fraction: 3.5 Gy
Plan Total Fractions Prescribed: 10
Plan Total Prescribed Dose: 35 Gy
Reference Point Dosage Given to Date: 17.5 Gy
Reference Point Session Dosage Given: 3.5 Gy
Session Number: 5

## 2022-11-16 ENCOUNTER — Other Ambulatory Visit: Payer: Self-pay

## 2022-11-16 ENCOUNTER — Ambulatory Visit
Admission: RE | Admit: 2022-11-16 | Discharge: 2022-11-16 | Disposition: A | Discharge status: Home / Self Care | Payer: Medicare PPO | Source: Ambulatory Visit | Attending: Radiation Oncology | Admitting: Radiation Oncology

## 2022-11-16 DIAGNOSIS — Z510 Encounter for antineoplastic radiation therapy: Secondary | ICD-10-CM | POA: Diagnosis not present

## 2022-11-16 LAB — RAD ONC ARIA SESSION SUMMARY
Course Elapsed Days: 7
Plan Fractions Treated to Date: 6
Plan Prescribed Dose Per Fraction: 3.5 Gy
Plan Total Fractions Prescribed: 10
Plan Total Prescribed Dose: 35 Gy
Reference Point Dosage Given to Date: 21 Gy
Reference Point Session Dosage Given: 3.5 Gy
Session Number: 6

## 2022-11-17 ENCOUNTER — Other Ambulatory Visit: Payer: Self-pay

## 2022-11-17 ENCOUNTER — Ambulatory Visit
Admission: RE | Admit: 2022-11-17 | Discharge: 2022-11-17 | Disposition: A | Discharge status: Home / Self Care | Payer: Medicare PPO | Source: Ambulatory Visit | Attending: Radiation Oncology | Admitting: Radiation Oncology

## 2022-11-17 DIAGNOSIS — Z510 Encounter for antineoplastic radiation therapy: Secondary | ICD-10-CM | POA: Diagnosis not present

## 2022-11-17 LAB — RAD ONC ARIA SESSION SUMMARY
Course Elapsed Days: 8
Plan Fractions Treated to Date: 7
Plan Prescribed Dose Per Fraction: 3.5 Gy
Plan Total Fractions Prescribed: 10
Plan Total Prescribed Dose: 35 Gy
Reference Point Dosage Given to Date: 24.5 Gy
Reference Point Session Dosage Given: 3.5 Gy
Session Number: 7

## 2022-11-18 ENCOUNTER — Inpatient Hospital Stay: Payer: Medicare PPO

## 2022-11-18 ENCOUNTER — Other Ambulatory Visit: Payer: Self-pay

## 2022-11-18 ENCOUNTER — Ambulatory Visit
Admission: RE | Admit: 2022-11-18 | Discharge: 2022-11-18 | Disposition: A | Discharge status: Home / Self Care | Payer: Medicare PPO | Source: Ambulatory Visit | Attending: Radiation Oncology | Admitting: Radiation Oncology

## 2022-11-18 DIAGNOSIS — Z510 Encounter for antineoplastic radiation therapy: Secondary | ICD-10-CM | POA: Diagnosis not present

## 2022-11-18 DIAGNOSIS — D0511 Intraductal carcinoma in situ of right breast: Secondary | ICD-10-CM

## 2022-11-18 LAB — COMPREHENSIVE METABOLIC PANEL
ALT: 16 U/L (ref 0–44)
AST: 20 U/L (ref 15–41)
Albumin: 4.3 g/dL (ref 3.5–5.0)
Alkaline Phosphatase: 69 U/L (ref 38–126)
Anion gap: 7 (ref 5–15)
BUN: 16 mg/dL (ref 8–23)
CO2: 29 mmol/L (ref 22–32)
Calcium: 9.6 mg/dL (ref 8.9–10.3)
Chloride: 100 mmol/L (ref 98–111)
Creatinine, Ser: 0.8 mg/dL (ref 0.44–1.00)
GFR, Estimated: >60 mL/min (ref >60)
Glucose, Bld: 90 mg/dL (ref 70–99)
Potassium: 4.6 mmol/L (ref 3.5–5.1)
Sodium: 136 mmol/L (ref 135–145)
Total Bilirubin: 0.9 mg/dL (ref 0.3–1.2)
Total Protein: 7.2 g/dL (ref 6.5–8.1)

## 2022-11-18 LAB — RAD ONC ARIA SESSION SUMMARY
Course Elapsed Days: 9
Plan Fractions Treated to Date: 8
Plan Prescribed Dose Per Fraction: 3.5 Gy
Plan Total Fractions Prescribed: 10
Plan Total Prescribed Dose: 35 Gy
Reference Point Dosage Given to Date: 28 Gy
Reference Point Session Dosage Given: 3.5 Gy
Session Number: 8

## 2022-11-18 LAB — CBC (CANCER CENTER ONLY)
HCT: 44 % (ref 36.0–46.0)
Hemoglobin: 14.4 g/dL (ref 12.0–15.0)
MCH: 29.6 pg (ref 26.0–34.0)
MCHC: 32.7 g/dL (ref 30.0–36.0)
MCV: 90.3 fL (ref 80.0–100.0)
Platelet Count: 234 10*3/uL (ref 150–400)
RBC: 4.87 MIL/uL (ref 3.87–5.11)
RDW: 11.8 % (ref 11.5–15.5)
WBC Count: 5.4 10*3/uL (ref 4.0–10.5)
nRBC: 0 % (ref 0.0–0.2)

## 2022-11-19 ENCOUNTER — Other Ambulatory Visit: Payer: Self-pay

## 2022-11-19 ENCOUNTER — Ambulatory Visit
Admission: RE | Admit: 2022-11-19 | Discharge: 2022-11-19 | Disposition: A | Discharge status: Home / Self Care | Payer: Medicare PPO | Source: Ambulatory Visit | Attending: Radiation Oncology | Admitting: Radiation Oncology

## 2022-11-19 DIAGNOSIS — Z510 Encounter for antineoplastic radiation therapy: Secondary | ICD-10-CM | POA: Diagnosis not present

## 2022-11-19 LAB — RAD ONC ARIA SESSION SUMMARY
Course Elapsed Days: 10
Plan Fractions Treated to Date: 9
Plan Prescribed Dose Per Fraction: 3.5 Gy
Plan Total Fractions Prescribed: 10
Plan Total Prescribed Dose: 35 Gy
Reference Point Dosage Given to Date: 31.5 Gy
Reference Point Session Dosage Given: 3.5 Gy
Session Number: 9

## 2022-11-20 ENCOUNTER — Other Ambulatory Visit: Payer: Self-pay

## 2022-11-20 ENCOUNTER — Encounter: Payer: Self-pay | Admitting: *Deleted

## 2022-11-20 ENCOUNTER — Ambulatory Visit
Admission: RE | Admit: 2022-11-20 | Discharge: 2022-11-20 | Disposition: A | Discharge status: Home / Self Care | Payer: Medicare PPO | Source: Ambulatory Visit | Attending: Radiation Oncology | Admitting: Radiation Oncology

## 2022-11-20 DIAGNOSIS — Z510 Encounter for antineoplastic radiation therapy: Secondary | ICD-10-CM | POA: Diagnosis not present

## 2022-11-20 LAB — RAD ONC ARIA SESSION SUMMARY
Course Elapsed Days: 11
Plan Fractions Treated to Date: 10
Plan Prescribed Dose Per Fraction: 3.5 Gy
Plan Total Fractions Prescribed: 10
Plan Total Prescribed Dose: 35 Gy
Reference Point Dosage Given to Date: 35 Gy
Reference Point Session Dosage Given: 3.5 Gy
Session Number: 10

## 2022-11-23 ENCOUNTER — Encounter: Payer: Self-pay | Admitting: *Deleted

## 2022-11-23 MED ORDER — LETROZOLE 2.5 MG PO TABS
2.5000 mg | ORAL_TABLET | Freq: Every day | ORAL | 3 refills | Status: DC
Start: 2022-11-23 — End: 2023-02-23

## 2022-11-23 NOTE — Progress Notes (Signed)
Called Ms. Lori Kidd and let her know that Letrozole Rx has been sent to [facility] in [place].  She will see Dr. Smith Robert back 02/10/23.   No concerns at this time.

## 2022-12-23 ENCOUNTER — Ambulatory Visit: Payer: Medicare PPO | Admitting: Radiation Oncology

## 2022-12-30 ENCOUNTER — Ambulatory Visit
Admission: RE | Admit: 2022-12-30 | Discharge: 2022-12-30 | Disposition: A | Discharge status: Home / Self Care | Payer: Medicare PPO | Source: Ambulatory Visit | Attending: Radiation Oncology | Admitting: Radiation Oncology

## 2022-12-30 VITALS — BP 136/81 | HR 88 | Temp 98.4°F | Resp 14 | Ht 63.0 in | Wt 150.7 lb

## 2022-12-30 DIAGNOSIS — D0511 Intraductal carcinoma in situ of right breast: Secondary | ICD-10-CM | POA: Insufficient documentation

## 2022-12-30 DIAGNOSIS — Z170 Estrogen receptor positive status [ER+]: Secondary | ICD-10-CM | POA: Insufficient documentation

## 2022-12-30 DIAGNOSIS — K5900 Constipation, unspecified: Secondary | ICD-10-CM | POA: Diagnosis not present

## 2022-12-30 DIAGNOSIS — R232 Flushing: Secondary | ICD-10-CM | POA: Diagnosis not present

## 2022-12-30 DIAGNOSIS — Z79811 Long term (current) use of aromatase inhibitors: Secondary | ICD-10-CM | POA: Insufficient documentation

## 2022-12-30 NOTE — Progress Notes (Signed)
Radiation Oncology Follow up Note  Name: Lori Kidd   Date:   12/30/2022 MRN:  [id number] DOB: 1950/08/11    This 72 y.o. female presents to the clinic today for 1 month follow-up status post whole breast radiation to her right breast for ER positive ductal carcinoma in situ.  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 She reports intermittent shooting pains in the area of the surgical site, which she understands to be due to healing nerve endings. She has also started on Femara, which she believes may be causing constipation. She has attempted to manage this with diet and exercise, but has not yet tried any over-the-counter remedies. She also reports experiencing hot flashes, but notes that this is not a new symptom for her.  COMPLICATIONS OF TREATMENT: none  FOLLOW UP COMPLIANCE: keeps appointments   PHYSICAL EXAM:  BP 136/81   Pulse 88   Temp 98.4 F (36.9 C)   Resp 14   Ht 5\' 3"  (1.6 m)   Wt 150 lb 11.2 oz (68.4 kg)   BMI 26.70 kg/m  Lungs are clear to A&P cardiac examination essentially unremarkable with regular rate and rhythm. No dominant mass or nodularity is noted in either breast in 2 positions examined. Incision is well-healed. No axillary or supraclavicular adenopathy is appreciated. Cosmetic result is excellent.  Well-developed well-nourished patient in NAD. HEENT reveals PERLA, EOMI, discs not visualized.  Oral cavity is clear. No oral mucosal lesions are identified. Neck is clear without evidence of cervical or supraclavicular adenopathy. Lungs are clear to A&P. Cardiac examination is essentially unremarkable with regular rate and rhythm without murmur rub or thrill. Abdomen is benign with no organomegaly or masses noted. Motor sensory and DTR levels are equal and symmetric in the upper and lower  extremities. Cranial nerves II through XII are grossly intact. Proprioception is intact. No peripheral adenopathy or edema is identified. No motor or sensory levels are noted. Crude visual fields are within normal range.  RADIOLOGY RESULTS: PATHOLOGY Wide local excision: ER-positive ductal carcinoma in situ of the right breast  PLAN: ER Positive Ductal Carcinoma In Situ, Right Breast Status post wide local excision. Patient is currently on Femara. Reports occasional shooting pains due to nerve healing post-surgery. -Continue Femara as prescribed. -Reassured patient that shooting pains are due to nerve healing and not cancer recurrence.  Constipation New onset, possibly related to Femara initiation. Patient has been trying to manage with diet and exercise. -Recommend trial of MiraLAX for constipation relief.  Follow-up Plan -Schedule follow-up in 6 months for breast exam and review of mammogram results. -Plan for two 58-month follow-ups, then annual follow-ups.    Carmina Miller, MD

## 2023-02-09 ENCOUNTER — Other Ambulatory Visit: Payer: Self-pay

## 2023-02-09 DIAGNOSIS — D0511 Intraductal carcinoma in situ of right breast: Secondary | ICD-10-CM

## 2023-02-10 ENCOUNTER — Inpatient Hospital Stay: Payer: Medicare PPO | Attending: Oncology

## 2023-02-10 ENCOUNTER — Encounter: Payer: Self-pay | Admitting: Oncology

## 2023-02-10 ENCOUNTER — Inpatient Hospital Stay: Payer: Medicare PPO | Admitting: Oncology

## 2023-02-10 VITALS — BP 113/73 | HR 88 | Temp 98.2°F | Resp 18 | Wt 148.3 lb

## 2023-02-10 DIAGNOSIS — Z79811 Long term (current) use of aromatase inhibitors: Secondary | ICD-10-CM | POA: Diagnosis not present

## 2023-02-10 DIAGNOSIS — Z803 Family history of malignant neoplasm of breast: Secondary | ICD-10-CM | POA: Insufficient documentation

## 2023-02-10 DIAGNOSIS — Z87891 Personal history of nicotine dependence: Secondary | ICD-10-CM | POA: Insufficient documentation

## 2023-02-10 DIAGNOSIS — Z79899 Other long term (current) drug therapy: Secondary | ICD-10-CM

## 2023-02-10 DIAGNOSIS — Z923 Personal history of irradiation: Secondary | ICD-10-CM | POA: Diagnosis not present

## 2023-02-10 DIAGNOSIS — Z08 Encounter for follow-up examination after completed treatment for malignant neoplasm: Secondary | ICD-10-CM

## 2023-02-10 DIAGNOSIS — D0511 Intraductal carcinoma in situ of right breast: Secondary | ICD-10-CM | POA: Insufficient documentation

## 2023-02-10 DIAGNOSIS — Z86000 Personal history of in-situ neoplasm of breast: Secondary | ICD-10-CM

## 2023-02-10 LAB — CMP (CANCER CENTER ONLY)
ALT: 25 U/L (ref 0–44)
AST: 25 U/L (ref 15–41)
Albumin: 4.1 g/dL (ref 3.5–5.0)
Alkaline Phosphatase: 68 U/L (ref 38–126)
Anion gap: 11 (ref 5–15)
BUN: 16 mg/dL (ref 8–23)
CO2: 26 mmol/L (ref 22–32)
Calcium: 9.3 mg/dL (ref 8.9–10.3)
Chloride: 102 mmol/L (ref 98–111)
Creatinine: 0.72 mg/dL (ref 0.44–1.00)
GFR, Estimated: >60 mL/min (ref >60)
Glucose, Bld: 88 mg/dL (ref 70–99)
Potassium: 3.5 mmol/L (ref 3.5–5.1)
Sodium: 139 mmol/L (ref 135–145)
Total Bilirubin: 0.8 mg/dL (ref 0.0–1.2)
Total Protein: 7 g/dL (ref 6.5–8.1)

## 2023-02-10 LAB — CBC (CANCER CENTER ONLY)
HCT: 45.5 % (ref 36.0–46.0)
Hemoglobin: 15.3 g/dL — ABNORMAL HIGH (ref 12.0–15.0)
MCH: 31.2 pg (ref 26.0–34.0)
MCHC: 33.6 g/dL (ref 30.0–36.0)
MCV: 92.9 fL (ref 80.0–100.0)
Platelet Count: 219 10*3/uL (ref 150–400)
RBC: 4.9 MIL/uL (ref 3.87–5.11)
RDW: 12.5 % (ref 11.5–15.5)
WBC Count: 8.9 10*3/uL (ref 4.0–10.5)
nRBC: 0 % (ref 0.0–0.2)

## 2023-02-10 NOTE — Progress Notes (Signed)
 Hematology/Oncology Consult note [facility]  [phone] [phone]  Patient Care Team: Monique Ano, MD as PCP - General (Family Medicine) Waverly Hageman, RN as Oncology Nurse Navigator Glenis Langdon, MD as Consulting Physician (Radiation Oncology)   Name of the patient: Lori Kidd  [id number]  1950-10-05   Date of visit: 02/10/23  Diagnosis-right breast DCIS ER positive  Chief complaint/ Reason for visit-routine follow-up of DCIS  Heme/Onc history: Patient is a 73 year old female who underwent a routine screening mammogram in August 2024 which showed possible calcifications in the right breast. This was followed by diagnostic mammogram which showed 0.9 cm group of pleomorphic calcifications in the right breast outer quadrant. 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    Patient underwent lumpectomy on 10/07/2022.  Final pathology showed 9 mm ER positive DCIS intermediate grade with comedonecrosis and negative margins of 3 mm.  She completed adjuvant radiation therapy and started taking letrozole  in November 2024.  Baseline bone density scan normal  Interval history-tolerating letrozole  well.  She is also taking her calcium and vitamin D.  Mild self-limited arthralgias and hot flashes which do not significantly affect her quality of life  ECOG PS- 1 Pain scale- 0  Review of systems- Review of Systems  Constitutional:  Negative for chills, fever, malaise/fatigue and weight loss.  HENT:  Negative for congestion, ear discharge and nosebleeds.   Eyes:  Negative for blurred vision.  Respiratory:  Negative for cough, hemoptysis, sputum production, shortness of breath and wheezing.   Cardiovascular:  Negative for chest pain, palpitations, orthopnea and claudication.  Gastrointestinal:  Negative for abdominal pain, blood in stool,  constipation, diarrhea, heartburn, melena, nausea and vomiting.  Genitourinary:  Negative for dysuria, flank pain, frequency, hematuria and urgency.  Musculoskeletal:  Negative for back pain, joint pain and myalgias.  Skin:  Negative for rash.  Neurological:  Negative for dizziness, tingling, focal weakness, seizures, weakness and headaches.  Endo/Heme/Allergies:  Does not bruise/bleed easily.  Psychiatric/Behavioral:  Negative for depression and suicidal ideas. The patient does not have insomnia.       Allergies  Allergen Reactions   Ace Inhibitors Cough   Penicillins Rash and Other (See Comments)    Redness Has patient had a PCN reaction causing immediate rash, facial/tongue/throat swelling, SOB or lightheadedness with hypotension: Yes Has patient had a PCN reaction causing severe rash involving mucus membranes or skin necrosis: No Has patient had a PCN reaction that required hospitalization: No Has patient had a PCN reaction occurring within the last 10 years: No If all of the above answers are "NO", then may proceed with Cephalosporin use.      Past Medical History:  Diagnosis Date   Arthritis    Asthma    Cancer (HCC)    Carotid stenosis    High blood pressure    High cholesterol    Hypothyroidism    Meniere disease    PAD (peripheral artery disease) (HCC)    Pneumonia    S/P TKR (total knee replacement), right      Past Surgical History:  Procedure Laterality Date   APPENDECTOMY  1982   BREAST BIOPSY Right 09/22/2022   stereo bx, x-clip, path pending   BREAST BIOPSY Right 09/22/2022   MM RT BREAST BX W LOC DEV 1ST LESION IMAGE BX SPEC STEREO GUIDE 09/22/2022 [facility]   BREAST BIOPSY Right 10/01/2022   MM RT  RADIO FREQUENCY TAG LOC MAMMO GUIDE 10/01/2022 [facility]   CATARACT EXTRACTION W/PHACO Right 03/17/2021   Procedure: CATARACT EXTRACTION PHACO AND INTRAOCULAR LENS PLACEMENT (IOC) RIGHT;  Surgeon: Rosa College, MD;  Location: [facility];  Service: Ophthalmology;  Laterality: Right;  4.96 00:35.7   COLONOSCOPY     Q5YRS SINCE 1998   COLONOSCOPY WITH PROPOFOL  N/A 01/29/2021   Procedure: COLONOSCOPY WITH PROPOFOL ;  Surgeon: Toledo, Alphonsus Jeans, MD;  Location: [facility];  Service: Gastroenterology;  Laterality: N/A;   KNEE ARTHROPLASTY Right 01/24/2018   Procedure: COMPUTER ASSISTED TOTAL KNEE ARTHROPLASTY;  Surgeon: Arlyne Lame, MD;  Location: [facility];  Service: Orthopedics;  Laterality: Right;   SIGMOID RESECTION / RECTOPEXY  1998   TONSILLECTOMY  1962   TUBAL LIGATION  1983   VAGINAL HYSTERECTOMY  2000    Social History   Socioeconomic History   Marital status: Married    Spouse name: Athena Bland   Number of children: Not on file   Years of education: Not on file   Highest education level: Not on file  Occupational History   Occupation: Retired Runner, broadcasting/film/video  Tobacco Use   Smoking status: Former    Types: Cigarettes   Smokeless tobacco: Never  Vaping Use   Vaping status: Never Used  Substance and Sexual Activity   Alcohol use: Yes    Alcohol/week: 3.0 standard drinks of alcohol    Types: 3 Cans of beer per week    Comment: NONE LAST 24HRS   Drug use: Never   Sexual activity: Not on file  Other Topics Concern   Not on file  Social History Narrative   Not on file   Social Drivers of Health   Financial Resource Strain: Low Risk  (04/22/2022)   Received [facility], [facility]   Overall Financial Resource Strain (CARDIA)    Difficulty of Paying Living Expenses: Not hard at all  Food Insecurity: No Food Insecurity (09/29/2022)   Hunger Vital Sign    Worried About Running Out of Food in the Last Year: Never true    Ran Out of Food in the Last Year: Never true  Transportation Needs: No Transportation Needs (04/22/2022)   Received from [facility], [facility]   [facility] - Transportation    In the past 12 months, has lack of  transportation kept you from medical appointments or from getting medications?: No    Lack of Transportation (Non-Medical): No  Physical Activity: Not on file  Stress: Not on file  Social Connections: Not on file  Intimate Partner Violence: Not At Risk (09/29/2022)   Humiliation, Afraid, Rape, and Kick questionnaire    Fear of Current or Ex-Partner: No    Emotionally Abused: No    Physically Abused: No    Sexually Abused: No    Family History  Problem Relation Age of Onset   Breast cancer Sister        early 52's   Pneumonia Mother    Pneumonia Father      Current Outpatient Medications:    Acetaminophen  500 MG capsule, Take by mouth every 6 (six) hours as needed for fever., Disp: , Rfl:    albuterol  (PROVENTIL  HFA;VENTOLIN  HFA) 108 (90 Base) MCG/ACT inhaler, Inhale 2 puffs into the lungs every 6 (six) hours as needed for wheezing or shortness of breath., Disp: , Rfl:    aspirin EC 81 MG tablet, Take 81 mg by mouth daily. Swallow whole.,  Disp: , Rfl:    Cholecalciferol 25 MCG (1000 UT) capsule, Take by mouth., Disp: , Rfl:    cyanocobalamin (VITAMIN B12) 1000 MCG tablet, Take 1,000 mcg by mouth daily., Disp: , Rfl:    EPINEPHrine  0.3 mg/0.3 mL IJ SOAJ injection, Inject 0.3 mg into the muscle once., Disp: , Rfl:    fluticasone  (FLONASE ) 50 MCG/ACT nasal spray, Place 1 spray into both nostrils daily., Disp: , Rfl:    letrozole  (FEMARA ) 2.5 MG tablet, Take 1 tablet (2.5 mg total) by mouth daily., Disp: 30 tablet, Rfl: 3   levothyroxine  (SYNTHROID , LEVOTHROID) 112 MCG tablet, Take 112 mcg by mouth daily before breakfast. , Disp: , Rfl:    loratadine  (CLARITIN ) 10 MG tablet, Take 10 mg by mouth daily as needed for allergies., Disp: , Rfl:    meclizine (ANTIVERT) 25 MG tablet, Take 25 mg by mouth 3 (three) times daily as needed for dizziness., Disp: , Rfl:    naproxen sodium (ALEVE) 220 MG tablet, Take 220 mg by mouth daily as needed., Disp: , Rfl:    Omega-3 1000 MG CAPS, Take 2,000 mg by  mouth daily. , Disp: , Rfl:    pravastatin  (PRAVACHOL ) 40 MG tablet, Take 1 tablet by mouth at bedtime., Disp: , Rfl:    traMADol  (ULTRAM ) 50 MG tablet, Take 1 tablet (50 mg total) by mouth every 6 (six) hours as needed., Disp: 20 tablet, Rfl: 0   tretinoin (RETIN-A) 0.025 % cream, SMARTSIG:sparingly Topical Every Evening, Disp: , Rfl:    triamterene -hydrochlorothiazide  (MAXZIDE -25) 37.5-25 MG tablet, Take 1 tablet by mouth daily., Disp: , Rfl:   Physical exam:  Vitals:   02/10/23 1008  BP: 113/73  Pulse: 88  Resp: 18  Temp: 98.2 F (36.8 C)  TempSrc: Tympanic  SpO2: 100%  Weight: 148 lb 4.8 oz (67.3 kg)   Physical Exam Cardiovascular:     Rate and Rhythm: Normal rate and regular rhythm.     Heart sounds: Normal heart sounds.  Pulmonary:     Effort: Pulmonary effort is normal.     Breath sounds: Normal breath sounds.  Skin:    General: Skin is warm and dry.  Neurological:     Mental Status: She is alert and oriented to person, place, and time.         Latest Ref Rng & Units 02/10/2023    9:47 AM  CMP  Glucose 70 - 99 mg/dL 88   BUN 8 - 23 mg/dL 16   Creatinine 1.61 - 1.00 mg/dL 0.96   Sodium 045 - 409 mmol/L 139   Potassium 3.5 - 5.1 mmol/L 3.5   Chloride 98 - 111 mmol/L 102   CO2 22 - 32 mmol/L 26   Calcium 8.9 - 10.3 mg/dL 9.3   Total Protein 6.5 - 8.1 g/dL 7.0   Total Bilirubin 0.0 - 1.2 mg/dL 0.8   Alkaline Phos 38 - 126 U/L 68   AST 15 - 41 U/L 25   ALT 0 - 44 U/L 25       Latest Ref Rng & Units 02/10/2023    9:47 AM  CBC  WBC 4.0 - 10.5 K/uL 8.9   Hemoglobin 12.0 - 15.0 g/dL 81.1   Hematocrit 91.4 - 46.0 % 45.5   Platelets 150 - 400 K/uL 219      Assessment and plan- Patient is a 73 y.o. female with history of ER positive DCIS status postlumpectomy and adjuvant radiation therapy currently on letrozole  here for routine follow-up  Patient is tolerating letrozole  well without any significant side effects.  She will continue to take this along with  calcium and vitamin D for 5 years.  So far she is not having any significant side effects from it and her baseline bone density scan is normal.  I will see her back in 4 months no labs   Visit Diagnosis 1. Encounter for follow-up surveillance of ductal carcinoma in situ (DCIS) of breast   2. High risk medication use   3. Use of letrozole  (Femara )      Dr. Seretha Dance, MD, MPH [facility] [phone] 02/10/2023 1:45 PM

## 2023-02-19 ENCOUNTER — Other Ambulatory Visit: Payer: Self-pay | Admitting: Oncology

## 2023-02-23 ENCOUNTER — Telehealth: Payer: Self-pay | Admitting: *Deleted

## 2023-02-23 MED ORDER — LETROZOLE 2.5 MG PO TABS: 2.5000 mg | ORAL_TABLET | Freq: Every day | ORAL | 3 refills | Status: DC

## 2023-02-23 NOTE — Telephone Encounter (Signed)
Called the pt and let her know , I got her voicemail and let her know that Smith Robert will sign it and it will go through to the pharmacy today

## 2023-03-05 ENCOUNTER — Telehealth: Payer: Self-pay | Admitting: *Deleted

## 2023-03-05 ENCOUNTER — Encounter: Payer: Self-pay | Admitting: *Deleted

## 2023-03-05 NOTE — Progress Notes (Signed)
 Lori Kidd called and is having vertigo for several days, she has had this previously.  She was not sure if it could be coming from the letrozole .   Dr. Melanee said it is probably not from the letrozole  but she could stop the letrozole  for 2 weeks and see if there is an improvement, if not see her ENT.  If she feels like it was a significant improvement she can be switched to an alternative medication.

## 2023-03-05 NOTE — Telephone Encounter (Signed)
 Patient called and says that she has had vertigo for 5 days she does have that in the past also but she feels like may be the letrozole  is causing.  I told her has not she already been on this medicine and she said yes for 3 months.  She also says that she called ENT and they will not be able to see her for several weeks about the vertigo and she did not know what to do.

## 2023-03-05 NOTE — Telephone Encounter (Signed)
 I have responded to Lori Kidd. She will call her

## 2023-04-05 ENCOUNTER — Encounter: Payer: Self-pay | Admitting: *Deleted

## 2023-04-05 NOTE — Progress Notes (Signed)
 Ms. Pryce called and said she received a bill for $3500 for her genetic testing that was done in September.   I contacted the genetic counselors at [employer] to see who she should contact about this bill as she was told it should not be any more then $300.    Maylon Cos said she will look into it and get back to me.

## 2023-05-22 ENCOUNTER — Other Ambulatory Visit: Payer: Self-pay | Admitting: Oncology

## 2023-06-15 ENCOUNTER — Encounter (INDEPENDENT_AMBULATORY_CARE_PROVIDER_SITE_OTHER): Payer: Self-pay

## 2023-06-16 ENCOUNTER — Encounter: Payer: Self-pay | Admitting: Oncology

## 2023-06-16 ENCOUNTER — Inpatient Hospital Stay: Payer: Medicare PPO | Attending: Oncology | Admitting: Oncology

## 2023-06-16 VITALS — BP 110/83 | HR 80 | Temp 97.6°F | Resp 19 | Ht 63.0 in | Wt 151.6 lb

## 2023-06-16 DIAGNOSIS — D0511 Intraductal carcinoma in situ of right breast: Secondary | ICD-10-CM | POA: Insufficient documentation

## 2023-06-16 DIAGNOSIS — Z87891 Personal history of nicotine dependence: Secondary | ICD-10-CM | POA: Insufficient documentation

## 2023-06-16 DIAGNOSIS — Z79899 Other long term (current) drug therapy: Secondary | ICD-10-CM | POA: Diagnosis not present

## 2023-06-16 DIAGNOSIS — Z79811 Long term (current) use of aromatase inhibitors: Secondary | ICD-10-CM

## 2023-06-16 DIAGNOSIS — Z803 Family history of malignant neoplasm of breast: Secondary | ICD-10-CM | POA: Insufficient documentation

## 2023-06-16 DIAGNOSIS — Z08 Encounter for follow-up examination after completed treatment for malignant neoplasm: Secondary | ICD-10-CM

## 2023-06-16 DIAGNOSIS — Z923 Personal history of irradiation: Secondary | ICD-10-CM | POA: Diagnosis not present

## 2023-06-16 NOTE — Progress Notes (Signed)
 Survivorship Care Plan visit completed.  Treatment summary reviewed and given to patient.  ASCO answers booklet reviewed and given to patient.  CARE program and Cancer Transitions discussed with patient along with other resources cancer center offers to patients and caregivers.  Patient verbalized understanding.

## 2023-06-16 NOTE — Progress Notes (Signed)
 Hematology/Oncology Consult note [facility]  [phone] [phone]  Patient Care Team: Monique Ano, MD as PCP - General (Family Medicine) Waverly Hageman, RN as Oncology Nurse Navigator Glenis Langdon, MD as Consulting Physician (Radiation Oncology) Eldred Grego, MD as Consulting Physician (General Surgery) Avonne Boettcher, MD as Consulting Physician (Oncology)   Name of the patient: Lori Kidd  [id number]  Nov 26, 1950   Date of visit: 06/16/23  Diagnosis-right breast DCIS ER positive  Chief complaint/ Reason for visit-routine follow-up of DCIS  Heme/Onc history: Patient is a 73 year old female who underwent a routine screening mammogram in August 2024 which showed possible calcifications in the right breast. This was followed by diagnostic mammogram which showed 0.9 cm group of pleomorphic calcifications in the right breast outer quadrant. 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    Patient underwent lumpectomy on 10/07/2022.  Final pathology showed 9 mm ER positive DCIS intermediate grade with comedonecrosis and negative margins of 3 mm.  She completed adjuvant radiation therapy and started taking letrozole  in November 2024.  Baseline bone density scan normal  Interval history-patient is tolerating letrozole  well but does report that her memory has somewhat worsened especially towards the end of the day.  She is not sure if this is related to letrozole .  She was also diagnosed with BPPV and symptoms are currently under good control.  ECOG PS- 1 Pain scale- 0   Review of systems- Review of Systems  Constitutional:  Negative for chills, fever, malaise/fatigue and weight loss.  HENT:  Negative for congestion, ear discharge and nosebleeds.   Eyes:  Negative for blurred vision.  Respiratory:  Negative for cough, hemoptysis,  sputum production, shortness of breath and wheezing.   Cardiovascular:  Negative for chest pain, palpitations, orthopnea and claudication.  Gastrointestinal:  Negative for abdominal pain, blood in stool, constipation, diarrhea, heartburn, melena, nausea and vomiting.  Genitourinary:  Negative for dysuria, flank pain, frequency, hematuria and urgency.  Musculoskeletal:  Negative for back pain, joint pain and myalgias.  Skin:  Negative for rash.  Neurological:  Negative for dizziness, tingling, focal weakness, seizures, weakness and headaches.  Endo/Heme/Allergies:  Does not bruise/bleed easily.  Psychiatric/Behavioral:  Negative for depression and suicidal ideas. The patient does not have insomnia.       Allergies  Allergen Reactions   Ace Inhibitors Cough   Penicillins Rash and Other (See Comments)    Redness Has patient had a PCN reaction causing immediate rash, facial/tongue/throat swelling, SOB or lightheadedness with hypotension: Yes Has patient had a PCN reaction causing severe rash involving mucus membranes or skin necrosis: No Has patient had a PCN reaction that required hospitalization: No Has patient had a PCN reaction occurring within the last 10 years: No If all of the above answers are "NO", then may proceed with Cephalosporin use.      Past Medical History:  Diagnosis Date   Arthritis    Asthma    Cancer (HCC)    Carotid stenosis    High blood pressure    High cholesterol    Hypothyroidism    Meniere disease    PAD (peripheral artery disease) (HCC)    Pneumonia    S/P TKR (total knee replacement), right      Past Surgical History:  Procedure Laterality Date   APPENDECTOMY  1982   BREAST BIOPSY Right 09/22/2022   stereo bx, x-clip, path pending  BREAST BIOPSY Right 09/22/2022   MM RT BREAST BX W LOC DEV 1ST LESION IMAGE BX SPEC STEREO GUIDE 09/22/2022 [facility]   BREAST BIOPSY Right 10/01/2022   MM RT RADIO FREQUENCY TAG LOC MAMMO GUIDE 10/01/2022  [facility]   CATARACT EXTRACTION W/PHACO Right 03/17/2021   Procedure: CATARACT EXTRACTION PHACO AND INTRAOCULAR LENS PLACEMENT (IOC) RIGHT;  Surgeon: Rosa College, MD;  Location: [facility];  Service: Ophthalmology;  Laterality: Right;  4.96 00:35.7   COLONOSCOPY     Q5YRS SINCE 1998   COLONOSCOPY WITH PROPOFOL  N/A 01/29/2021   Procedure: COLONOSCOPY WITH PROPOFOL ;  Surgeon: Toledo, Alphonsus Jeans, MD;  Location: [facility];  Service: Gastroenterology;  Laterality: N/A;   KNEE ARTHROPLASTY Right 01/24/2018   Procedure: COMPUTER ASSISTED TOTAL KNEE ARTHROPLASTY;  Surgeon: Arlyne Lame, MD;  Location: [facility];  Service: Orthopedics;  Laterality: Right;   SIGMOID RESECTION / RECTOPEXY  1998   TONSILLECTOMY  1962   TUBAL LIGATION  1983   VAGINAL HYSTERECTOMY  2000    Social History   Socioeconomic History   Marital status: Married    Spouse name: Athena Bland   Number of children: Not on file   Years of education: Not on file   Highest education level: Not on file  Occupational History   Occupation: Retired Runner, broadcasting/film/video  Tobacco Use   Smoking status: Former    Types: Cigarettes   Smokeless tobacco: Never  Vaping Use   Vaping status: Never Used  Substance and Sexual Activity   Alcohol use: Yes    Alcohol/week: 3.0 standard drinks of alcohol    Types: 3 Cans of beer per week    Comment: NONE LAST 24HRS   Drug use: Never   Sexual activity: Not on file  Other Topics Concern   Not on file  Social History Narrative   Not on file   Social Drivers of Health   Financial Resource Strain: Low Risk  (04/22/2022)   [facility], [facility]   Overall Financial Resource Strain (CARDIA)    Difficulty of Paying Living Expenses: Not hard at all  Food Insecurity: No Food Insecurity (09/29/2022)   Hunger Vital Sign    Worried About Running Out of Food in the Last Year: Never true    Ran Out of Food in the Last Year: Never true   Transportation Needs: No Transportation Needs (04/22/2022)   Received from [facility], [facility]   [facility] - Transportation    In the past 12 months, has lack of transportation kept you from medical appointments or from getting medications?: No    Lack of Transportation (Non-Medical): No  Physical Activity: Not on file  Stress: Not on file  Social Connections: Not on file  Intimate Partner Violence: Not At Risk (09/29/2022)   Humiliation, Afraid, Rape, and Kick questionnaire    Fear of Current or Ex-Partner: No    Emotionally Abused: No    Physically Abused: No    Sexually Abused: No    Family History  Problem Relation Age of Onset   Breast cancer Sister        early 77's   Pneumonia Mother    Pneumonia Father      Current Outpatient Medications:    valACYclovir (VALTREX) 1000 MG tablet, Take 1,000 mg by mouth 3 (three) times daily., Disp: , Rfl:    Acetaminophen  500 MG capsule, Take by mouth every 6 (six) hours as needed for fever.,  Disp: , Rfl:    albuterol  (PROVENTIL  HFA;VENTOLIN  HFA) 108 (90 Base) MCG/ACT inhaler, Inhale 2 puffs into the lungs every 6 (six) hours as needed for wheezing or shortness of breath., Disp: , Rfl:    aspirin EC 81 MG tablet, Take 81 mg by mouth daily. Swallow whole., Disp: , Rfl:    Cholecalciferol 25 MCG (1000 UT) capsule, Take by mouth., Disp: , Rfl:    cyanocobalamin (VITAMIN B12) 1000 MCG tablet, Take 1,000 mcg by mouth daily., Disp: , Rfl:    EPINEPHrine  0.3 mg/0.3 mL IJ SOAJ injection, Inject 0.3 mg into the muscle once., Disp: , Rfl:    fluticasone  (FLONASE ) 50 MCG/ACT nasal spray, Place 1 spray into both nostrils daily., Disp: , Rfl:    letrozole  (FEMARA ) 2.5 MG tablet, TAKE 1 TABLET BY MOUTH EVERY DAY, Disp: 90 tablet, Rfl: 1   levothyroxine  (SYNTHROID , LEVOTHROID) 112 MCG tablet, Take 112 mcg by mouth daily before breakfast. , Disp: , Rfl:    loratadine  (CLARITIN ) 10 MG tablet, Take 10 mg by mouth daily  as needed for allergies., Disp: , Rfl:    meclizine (ANTIVERT) 25 MG tablet, Take 25 mg by mouth 3 (three) times daily as needed for dizziness., Disp: , Rfl:    naproxen sodium (ALEVE) 220 MG tablet, Take 220 mg by mouth daily as needed., Disp: , Rfl:    Omega-3 1000 MG CAPS, Take 2,000 mg by mouth daily. , Disp: , Rfl:    pravastatin  (PRAVACHOL ) 40 MG tablet, Take 1 tablet by mouth at bedtime., Disp: , Rfl:    traMADol  (ULTRAM ) 50 MG tablet, Take 1 tablet (50 mg total) by mouth every 6 (six) hours as needed., Disp: 20 tablet, Rfl: 0   tretinoin (RETIN-A) 0.025 % cream, SMARTSIG:sparingly Topical Every Evening, Disp: , Rfl:    triamterene -hydrochlorothiazide  (MAXZIDE -25) 37.5-25 MG tablet, Take 1 tablet by mouth daily., Disp: , Rfl:   Physical exam:  Vitals:   06/16/23 0958  BP: 110/83  Pulse: 80  Resp: 19  Temp: 97.6 F (36.4 C)  TempSrc: Tympanic  SpO2: 100%  Weight: 151 lb 9.6 oz (68.8 kg)  Height: 5\' 3"  (1.6 m)   Physical Exam Cardiovascular:     Rate and Rhythm: Normal rate and regular rhythm.     Heart sounds: Normal heart sounds.  Pulmonary:     Effort: Pulmonary effort is normal.     Breath sounds: Normal breath sounds.  Skin:    General: Skin is warm and dry.  Neurological:     Mental Status: She is alert and oriented to person, place, and time.      I have personally reviewed labs listed below:    Latest Ref Rng & Units 02/10/2023    9:47 AM  CMP  Glucose 70 - 99 mg/dL 88   BUN 8 - 23 mg/dL 16   Creatinine 1.61 - 1.00 mg/dL 0.96   Sodium 045 - 409 mmol/L 139   Potassium 3.5 - 5.1 mmol/L 3.5   Chloride 98 - 111 mmol/L 102   CO2 22 - 32 mmol/L 26   Calcium 8.9 - 10.3 mg/dL 9.3   Total Protein 6.5 - 8.1 g/dL 7.0   Total Bilirubin 0.0 - 1.2 mg/dL 0.8   Alkaline Phos 38 - 126 U/L 68   AST 15 - 41 U/L 25   ALT 0 - 44 U/L 25       Latest Ref Rng & Units 02/10/2023    9:47 AM  CBC  WBC 4.0 - 10.5 K/uL 8.9   Hemoglobin 12.0 - 15.0 g/dL 16.1   Hematocrit  09.6 - 46.0 % 45.5   Platelets 150 - 400 K/uL 219      Assessment and plan- Patient is a 73 y.o. female with history of right breast DCIS ER positive presently on letrozole  here for a routine follow-up  It is unlikely that her memory issues are related to letrozole  but have asked her to hold off on taking the medication for 3 to 4 weeks and see how she is doing with her symptoms.  She will let us  know if she would like to continue with letrozole  following that or switch to an alternative AI.  She will continue with calcium and vitamin D in the meanwhile.Patient completed her adjuvant radiation therapy as well.  Baseline bone density scan is normal.  I will see her back in 6 months no labs   Visit Diagnosis 1. Encounter for follow-up surveillance of ductal carcinoma in situ (DCIS) of breast   2. Use of letrozole  (Femara )   3. High risk medication use      Dr. Seretha Dance, MD, MPH [facility] [phone] 06/16/2023 12:59 PM

## 2023-06-18 ENCOUNTER — Emergency Department

## 2023-06-18 ENCOUNTER — Other Ambulatory Visit: Payer: Self-pay

## 2023-06-18 ENCOUNTER — Encounter: Payer: Self-pay | Admitting: Emergency Medicine

## 2023-06-18 ENCOUNTER — Emergency Department
Admission: EM | Admit: 2023-06-18 | Discharge: 2023-06-18 | Disposition: A | Discharge status: Home / Self Care | Attending: Emergency Medicine | Admitting: Emergency Medicine

## 2023-06-18 DIAGNOSIS — E039 Hypothyroidism, unspecified: Secondary | ICD-10-CM | POA: Insufficient documentation

## 2023-06-18 DIAGNOSIS — M5412 Radiculopathy, cervical region: Secondary | ICD-10-CM | POA: Insufficient documentation

## 2023-06-18 DIAGNOSIS — R200 Anesthesia of skin: Secondary | ICD-10-CM

## 2023-06-18 DIAGNOSIS — I10 Essential (primary) hypertension: Secondary | ICD-10-CM | POA: Diagnosis not present

## 2023-06-18 LAB — COMPREHENSIVE METABOLIC PANEL WITH GFR
ALT: 16 U/L (ref 0–44)
AST: 24 U/L (ref 15–41)
Albumin: 3.9 g/dL (ref 3.5–5.0)
Alkaline Phosphatase: 61 U/L (ref 38–126)
Anion gap: 12 (ref 5–15)
BUN: 25 mg/dL — ABNORMAL HIGH (ref 8–23)
CO2: 26 mmol/L (ref 22–32)
Calcium: 9.4 mg/dL (ref 8.9–10.3)
Chloride: 101 mmol/L (ref 98–111)
Creatinine, Ser: 0.85 mg/dL (ref 0.44–1.00)
GFR, Estimated: >60 mL/min (ref >60)
Glucose, Bld: 96 mg/dL (ref 70–99)
Potassium: 3.2 mmol/L — ABNORMAL LOW (ref 3.5–5.1)
Sodium: 139 mmol/L (ref 135–145)
Total Bilirubin: 0.9 mg/dL (ref 0.0–1.2)
Total Protein: 6.6 g/dL (ref 6.5–8.1)

## 2023-06-18 LAB — CBC
HCT: 39.6 % (ref 36.0–46.0)
Hemoglobin: 13.5 g/dL (ref 12.0–15.0)
MCH: 30.8 pg (ref 26.0–34.0)
MCHC: 34.1 g/dL (ref 30.0–36.0)
MCV: 90.4 fL (ref 80.0–100.0)
Platelets: 217 10*3/uL (ref 150–400)
RBC: 4.38 MIL/uL (ref 3.87–5.11)
RDW: 12.1 % (ref 11.5–15.5)
WBC: 5.2 10*3/uL (ref 4.0–10.5)
nRBC: 0 % (ref 0.0–0.2)

## 2023-06-18 LAB — DIFFERENTIAL
Abs Immature Granulocytes: 0.01 10*3/uL (ref 0.00–0.07)
Basophils Absolute: 0 10*3/uL (ref 0.0–0.1)
Basophils Relative: 1 %
Eosinophils Absolute: 0.3 10*3/uL (ref 0.0–0.5)
Eosinophils Relative: 5 %
Immature Granulocytes: 0 %
Lymphocytes Relative: 24 %
Lymphs Abs: 1.3 10*3/uL (ref 0.7–4.0)
Monocytes Absolute: 0.5 10*3/uL (ref 0.1–1.0)
Monocytes Relative: 9 %
Neutro Abs: 3.3 10*3/uL (ref 1.7–7.7)
Neutrophils Relative %: 61 %

## 2023-06-18 LAB — APTT: aPTT: 33 s (ref 24–36)

## 2023-06-18 LAB — PROTIME-INR
INR: 1 (ref 0.8–1.2)
Prothrombin Time: 13.1 s (ref 11.4–15.2)

## 2023-06-18 MED ORDER — SODIUM CHLORIDE 0.9% FLUSH
3.0000 mL | Freq: Once | INTRAVENOUS | Status: AC
  Administered 2023-06-18: 3 mL via INTRAVENOUS

## 2023-06-18 NOTE — ED Triage Notes (Addendum)
 Patient ambulatory to triage with steady gait, without difficulty or distress noted; pt reports that she awoke at 530am with left sided facial numbness; denies any accomp symptoms; pt A&Ox3; MAEW; 3-81mg  ASA taken PTA

## 2023-06-18 NOTE — ED Provider Notes (Signed)
 [facility] Provider Note   Event Date/Time   First MD Initiated Contact with Patient 06/18/23 0701     (approximate) History  Numbness  HPI Lori Kidd is a 73 y.o. female with a past medical history of arthritis, hypothyroidism, hypercholesterolemia, hypertension, and peripheral arterial disease who presents complaining of left-sided facial numbness and intermittent left-sided upper extremity paresthesias since she awoke this morning.  Patient's last known well time being 2200 last night.  Patient states that the symptoms have gradually improved since she has woken up however there is occasional shooting paresthesia sensations down the left arm.  Patient denies any known arthritis or degenerative disc disease in the neck.  Patient denies any neck pain.  Patient denies any facial droop, vision changes, auditory changes, or ptosis ROS: Patient currently denies any tinnitus, difficulty speaking, sore throat, chest pain, shortness of breath, abdominal pain, nausea/vomiting/diarrhea, dysuria, or weakness/numbness/paresthesias in any extremity   Physical Exam  Triage Vital Signs: ED Triage Vitals  Encounter Vitals Group     BP 06/18/23 0640 (!) 159/82     Systolic BP Percentile --      Diastolic BP Percentile --      Pulse Rate 06/18/23 0640 70     Resp 06/18/23 0640 16     Temp 06/18/23 0640 (!) 97.5 F (36.4 C)     Temp Source 06/18/23 0640 Oral     SpO2 06/18/23 0640 100 %     Weight 06/18/23 0630 150 lb (68 kg)     Height 06/18/23 0630 5\' 3"  (1.6 m)     Head Circumference --      Peak Flow --      Pain Score 06/18/23 0630 0     Pain Loc --      Pain Education --      Exclude from Growth Chart --    Most recent vital signs: Vitals:   06/18/23 0800 06/18/23 0944  BP: 127/77   Pulse: 71 66  Resp: 15 13  Temp:    SpO2: 100% 100%   General: Awake, oriented x4. CV:  Good peripheral perfusion.  Resp:  Normal effort.  Abd:  No distention.   Other:  Elderly overweight Caucasian female resting comfortably in no acute distress.  Mild decrease sensation over left cheek ED Results / Procedures / Treatments  Labs (all labs ordered are listed, but only abnormal results are displayed) Labs Reviewed  COMPREHENSIVE METABOLIC PANEL WITH GFR - Abnormal; Notable for the following components:      Result Value   Potassium 3.2 (*)    BUN 25 (*)    All other components within normal limits  PROTIME-INR  APTT  CBC  DIFFERENTIAL  CBG MONITORING, ED  RADIOLOGY ED MD interpretation: CT of the head without contrast interpreted by me shows no evidence of acute abnormalities including no intracerebral hemorrhage, obvious masses, or significant edema MRI of the brain without contrast shows no acute intracranial findings with minor chronic small vessel ischemic changes within the cerebral white matter.  Imaging independently interpreted -Agree with radiology assessment Official radiology report(s): MR BRAIN WO CONTRAST Result Date: 06/18/2023 CLINICAL DATA:  Provided history: Neuro deficit, acute, stroke suspected. Additional history provided: Left-sided facial numbness. EXAM: MRI HEAD WITHOUT CONTRAST TECHNIQUE: Multiplanar, multiecho pulse sequences of the brain and surrounding structures were obtained without intravenous contrast. COMPARISON:  Head CT 06/18/2023. FINDINGS: Brain: No age-advanced or lobar predominant cerebral atrophy. Minimal multifocal T2 FLAIR hyperintense signal abnormality within  the cerebral white matter, nonspecific but compatible with changes of chronic small vessel ischemia. No cortical encephalomalacia is identified. There is no acute infarct. No evidence of an intracranial mass. No chronic intracranial blood products. No extra-axial fluid collection. No midline shift. Vascular: Maintained flow voids within the proximal large arterial vessels. Skull and upper cervical spine: No focal worrisome marrow lesion. Sinuses/Orbits: No  mass or acute finding within the imaged orbits. Prior right ocular lens replacement. Trace mucosal thickening within the left maxillary sinus. Mild mucosal thickening within a posterior right ethmoid air cell. IMPRESSION: 1. No acute intracranial finding. 2. Minor chronic small vessel ischemic changes within the cerebral white matter. 3. Mild paranasal sinus mucosal thickening. Electronically Signed   By: Bascom Lily D.O.   On: 06/18/2023 09:19   CT HEAD WO CONTRAST Result Date: 06/18/2023 CLINICAL DATA:  Numbness or tingling EXAM: CT HEAD WITHOUT CONTRAST TECHNIQUE: Contiguous axial images were obtained from the base of the skull through the vertex without intravenous contrast. RADIATION DOSE REDUCTION: This exam was performed according to the departmental dose-optimization program which includes automated exposure control, adjustment of the mA and/or kV according to patient size and/or use of iterative reconstruction technique. COMPARISON:  None Available. FINDINGS: Brain: No evidence of acute infarction, hemorrhage, hydrocephalus, extra-axial collection or mass lesion/mass effect. Vascular: No hyperdense vessel or unexpected calcification. Skull: Normal. Negative for fracture or focal lesion. Sinuses/Orbits: No acute finding. IMPRESSION: No acute finding.  No hemorrhage or visible infarct. Electronically Signed   By: Ronnette Coke M.D.   On: 06/18/2023 07:21   PROCEDURES: Critical Care performed: No .1-3 Lead EKG Interpretation  Performed by: Charleen Conn, MD Authorized by: Charleen Conn, MD     Interpretation: normal     ECG rate:  61   ECG rate assessment: normal     Rhythm: sinus rhythm     Ectopy: none     Conduction: normal    MEDICATIONS ORDERED IN ED: Medications  sodium chloride  flush (NS) 0.9 % injection 3 mL (3 mLs Intravenous Given 06/18/23 0657)   IMPRESSION / MDM / ASSESSMENT AND PLAN / ED COURSE  I reviewed the triage vital signs and the nursing notes.                              The patient is on the cardiac monitor to evaluate for evidence of arrhythmia and/or significant heart rate changes. Patient's presentation is most consistent with acute presentation with potential threat to life or bodily function. Presents with sensation of tingling to the left face and left upper extremity with additional numbness.  Patient's symptoms and work-up not consistent with a stroke and therefore they will be discharged from the ED.  Patient has currently been stabilized in the emergency department.  Patient received an head CT/MRI brain that was negative for stroke.  Patient's symptoms not typical for other emergent causes such as dissection, infection, DKA, trauma. Patient will be discharged with strict return precautions and follow up with primary MD within 24 hours for further evaluation.   FINAL CLINICAL IMPRESSION(S) / ED DIAGNOSES   Final diagnoses:  Left facial numbness  Cervical radiculopathy   Rx / DC Orders   ED Discharge Orders     None      Note:  This document was prepared using Dragon voice recognition software and may include unintentional dictation errors.   Cambell Stanek K, MD 06/18/23 [phone]

## 2023-06-18 NOTE — Discharge Instructions (Addendum)
 Please use ibuprofen (Motrin) up to 800 mg every 8 hours, naproxen (Naprosyn) up to 500 mg every 12 hours, and/or acetaminophen (Tylenol) up to 4 g/day for any continued pain.  Please do not use this medication regimen for longer than 7 days

## 2023-07-07 ENCOUNTER — Encounter: Payer: Self-pay | Admitting: Radiation Oncology

## 2023-07-07 ENCOUNTER — Ambulatory Visit
Admission: RE | Admit: 2023-07-07 | Discharge: 2023-07-07 | Disposition: A | Discharge status: Home / Self Care | Payer: Medicare PPO | Source: Ambulatory Visit | Attending: Radiation Oncology | Admitting: Radiation Oncology

## 2023-07-07 VITALS — BP 142/83 | HR 78 | Temp 98.0°F | Resp 16

## 2023-07-07 DIAGNOSIS — Z170 Estrogen receptor positive status [ER+]: Secondary | ICD-10-CM | POA: Diagnosis not present

## 2023-07-07 DIAGNOSIS — Z79811 Long term (current) use of aromatase inhibitors: Secondary | ICD-10-CM | POA: Insufficient documentation

## 2023-07-07 DIAGNOSIS — D0511 Intraductal carcinoma in situ of right breast: Secondary | ICD-10-CM | POA: Insufficient documentation

## 2023-07-07 DIAGNOSIS — Z923 Personal history of irradiation: Secondary | ICD-10-CM | POA: Insufficient documentation

## 2023-07-07 NOTE — Progress Notes (Signed)
 Radiation Oncology Follow up Note  Name: Lori Kidd   Date:   07/07/2023 MRN:  [id number] DOB: 1950-11-27    This 73 y.o. female presents to the clinic today for 22-month follow-up status post whole breast radiation to her right breast for ER positive ductal carcinoma in situ.  REFERRING PROVIDER: Monique Ano, MD  HPI: Patient is a 73 year old female now out 7 months having completed whole breast radiation to her right breast for ER positive ductal carcinoma in situ.  Seen today in routine follow-up she is doing well.  She specifically [place] breast tenderness cough or bone pain.  She was on endocrine therapy although that has been temporarily discontinued for some cognitive issues.  She has not yet had a mammogram..  COMPLICATIONS OF TREATMENT: none  FOLLOW UP COMPLIANCE: keeps appointments   PHYSICAL EXAM:  BP (!) 142/83   Pulse 78   Temp 98 F (36.7 C) (Temporal)   Resp 16  Lungs are clear to A&P cardiac examination essentially unremarkable with regular rate and rhythm. No dominant mass or nodularity is noted in either breast in 2 positions examined. Incision is well-healed. No axillary or supraclavicular adenopathy is appreciated. Cosmetic result is excellent.  Well-developed well-nourished patient in NAD. HEENT reveals PERLA, EOMI, discs not visualized.  Oral cavity is clear. No oral mucosal lesions are identified. Neck is clear without evidence of cervical or supraclavicular adenopathy. Lungs are clear to A&P. Cardiac examination is essentially unremarkable with regular rate and rhythm without murmur rub or thrill. Abdomen is benign with no organomegaly or masses noted. Motor sensory and DTR levels are equal and symmetric in the upper and lower extremities. Cranial nerves II through XII are grossly intact. Proprioception is intact. No peripheral adenopathy or edema is identified. No motor or sensory levels are noted. Crude visual fields are within normal  range.  RADIOLOGY RESULTS: No current films to review  PLAN: 1 time patient is doing well now at 7 months from whole breast radiation I am pleased with her overall progress.  I have asked to see her back in 6 months for follow-up and then we will go to once year follow-up visits.  Patient comprehends my recommendations well.  I would like to take this opportunity to thank you for allowing me to participate in the care of your patient.Glenis Langdon, MD

## 2023-08-10 ENCOUNTER — Ambulatory Visit: Payer: Medicare PPO | Admitting: Oncology

## 2023-09-08 ENCOUNTER — Other Ambulatory Visit: Payer: Self-pay | Admitting: Oncology

## 2023-09-08 DIAGNOSIS — R928 Other abnormal and inconclusive findings on diagnostic imaging of breast: Secondary | ICD-10-CM

## 2023-09-08 DIAGNOSIS — Z1231 Encounter for screening mammogram for malignant neoplasm of breast: Secondary | ICD-10-CM

## 2023-09-08 DIAGNOSIS — Z86000 Personal history of in-situ neoplasm of breast: Secondary | ICD-10-CM

## 2023-09-09 ENCOUNTER — Other Ambulatory Visit (INDEPENDENT_AMBULATORY_CARE_PROVIDER_SITE_OTHER): Payer: Self-pay | Admitting: Vascular Surgery

## 2023-09-09 DIAGNOSIS — I6521 Occlusion and stenosis of right carotid artery: Secondary | ICD-10-CM

## 2023-09-10 ENCOUNTER — Encounter (INDEPENDENT_AMBULATORY_CARE_PROVIDER_SITE_OTHER): Payer: Self-pay | Admitting: Vascular Surgery

## 2023-09-10 ENCOUNTER — Ambulatory Visit (INDEPENDENT_AMBULATORY_CARE_PROVIDER_SITE_OTHER): Payer: Medicare PPO

## 2023-09-10 ENCOUNTER — Ambulatory Visit (INDEPENDENT_AMBULATORY_CARE_PROVIDER_SITE_OTHER): Payer: Medicare PPO | Admitting: Vascular Surgery

## 2023-09-10 VITALS — BP 115/78 | HR 73 | Ht 63.0 in | Wt 157.6 lb

## 2023-09-10 DIAGNOSIS — I6521 Occlusion and stenosis of right carotid artery: Secondary | ICD-10-CM

## 2023-09-10 DIAGNOSIS — I10 Essential (primary) hypertension: Secondary | ICD-10-CM

## 2023-09-10 DIAGNOSIS — E782 Mixed hyperlipidemia: Secondary | ICD-10-CM | POA: Diagnosis not present

## 2023-09-10 NOTE — Assessment & Plan Note (Signed)
 Her carotid duplex today shows minimal carotid disease in the lower end of the 1-39%. No changes. Recheck in one year. Continue aspirin and statin agent.

## 2023-09-10 NOTE — Progress Notes (Signed)
 MRN : [id number]  Lori Kidd is a 73 y.o. (October 23, 1950) female who presents with chief complaint of  Chief Complaint  Patient presents with   Carotid    1 year follow up with carotid see jd/fb, pt had scan for stroke like symptoms, she is having issues with Brain fog pcp has started on VitB 12 that has help. She is on a medication  for CA that can cause Brain Fog    .  History of Present Illness: Patient returns in follow-up of her carotid disease.  She was having some neurologic symptoms a few months ago and went and got checked out and her stroke workup was negative.  She was found to be markedly B12 deficient and is on replacement therapy and this is helping. Her carotid duplex today shows minimal carotid disease in the lower end of the 1-39%  Current Outpatient Medications  Medication Sig Dispense Refill   Acetaminophen 500 MG capsule Take by mouth every 6 (six) hours as needed for fever.     albuterol (PROVENTIL HFA;VENTOLIN HFA) 108 (90 Base) MCG/ACT inhaler Inhale 2 puffs into the lungs every 6 (six) hours as needed for wheezing or shortness of breath.     aspirin EC 81 MG tablet Take 81 mg by mouth daily. Swallow whole.     Cholecalciferol 25 MCG (1000 UT) capsule Take by mouth.     cyanocobalamin (VITAMIN B12) 1000 MCG tablet Take 1,000 mcg by mouth daily.     EPINEPHrine 0.3 mg/0.3 mL IJ SOAJ injection Inject 0.3 mg into the muscle once.     fluticasone (FLONASE) 50 MCG/ACT nasal spray Place 1 spray into both nostrils daily.     levothyroxine (SYNTHROID, LEVOTHROID) 112 MCG tablet Take 112 mcg by mouth daily before breakfast.      loratadine (CLARITIN) 10 MG tablet Take 10 mg by mouth daily as needed for allergies.     meclizine (ANTIVERT) 25 MG tablet Take 25 mg by mouth 3 (three) times daily as needed for dizziness.     naproxen sodium (ALEVE) 220 MG tablet Take 220 mg by mouth daily as needed.     Omega-3 1000 MG CAPS Take 2,000 mg by mouth daily.      pravastatin  (PRAVACHOL) 40 MG tablet Take 1 tablet by mouth at bedtime.     traMADol (ULTRAM) 50 MG tablet Take 1 tablet (50 mg total) by mouth every 6 (six) hours as needed. 20 tablet 0   tretinoin (RETIN-A) 0.025 % cream SMARTSIG:sparingly Topical Every Evening     triamterene-hydrochlorothiazide (MAXZIDE-25) 37.5-25 MG tablet Take 1 tablet by mouth daily.     letrozole (FEMARA) 2.5 MG tablet TAKE 1 TABLET BY MOUTH EVERY DAY (Patient not taking: Reported on 09/10/2023) 90 tablet 1   No current facility-administered medications for this visit.    Past Medical History:  Diagnosis Date   Arthritis    Asthma    Cancer (HCC)    Carotid stenosis    High blood pressure    High cholesterol    Hypothyroidism    Meniere disease    PAD (peripheral artery disease) (HCC)    Pneumonia    S/P TKR (total knee replacement), right     Past Surgical History:  Procedure Laterality Date   APPENDECTOMY  1982   BREAST BIOPSY Right 09/22/2022   stereo bx, x-clip, path pending   BREAST BIOPSY Right 09/22/2022   MM RT BREAST BX W LOC DEV 1ST LESION IMAGE BX  SPEC STEREO GUIDE 09/22/2022 [facility]   BREAST BIOPSY Right 10/01/2022   MM RT RADIO FREQUENCY TAG LOC MAMMO GUIDE 10/01/2022 [facility]   CATARACT EXTRACTION W/PHACO Right 03/17/2021   Procedure: CATARACT EXTRACTION PHACO AND INTRAOCULAR LENS PLACEMENT (IOC) RIGHT;  Surgeon: Myrna Adine Anes, MD;  Location: [facility];  Service: Ophthalmology;  Laterality: Right;  4.96 00:35.7   COLONOSCOPY     Q5YRS SINCE 1998   COLONOSCOPY WITH PROPOFOL  N/A 01/29/2021   Procedure: COLONOSCOPY WITH PROPOFOL ;  Surgeon: Toledo, Ladell POUR, MD;  Location: [facility];  Service: Gastroenterology;  Laterality: N/A;   KNEE ARTHROPLASTY Right 01/24/2018   Procedure: COMPUTER ASSISTED TOTAL KNEE ARTHROPLASTY;  Surgeon: Mardee Lynwood SQUIBB, MD;  Location: [facility];  Service: Orthopedics;  Laterality: Right;   SIGMOID RESECTION / RECTOPEXY  1998   TONSILLECTOMY   1962   TUBAL LIGATION  1983   VAGINAL HYSTERECTOMY  2000     Social History   Tobacco Use   Smoking status: Former    Types: Cigarettes   Smokeless tobacco: Never  Vaping Use   Vaping status: Never Used  Substance Use Topics   Alcohol use: Yes    Alcohol/week: 3.0 standard drinks of alcohol    Types: 3 Cans of beer per week    Comment: NONE LAST 24HRS   Drug use: Never      Family History  Problem Relation Age of Onset   Breast cancer Sister        early 72's   Pneumonia Mother    Pneumonia Father      Allergies  Allergen Reactions   Ace Inhibitors Cough   Penicillins Rash and Other (See Comments)    Redness Has patient had a PCN reaction causing immediate rash, facial/tongue/throat swelling, SOB or lightheadedness with hypotension: Yes Has patient had a PCN reaction causing severe rash involving mucus membranes or skin necrosis: No Has patient had a PCN reaction that required hospitalization: No Has patient had a PCN reaction occurring within the last 10 years: No If all of the above answers are NO, then may proceed with Cephalosporin use.      REVIEW OF SYSTEMS (Negative unless checked)   Constitutional: [] Weight loss  [] Fever  [] Chills Cardiac: [] Chest pain   [] Chest pressure   [] Palpitations   [] Shortness of breath when laying flat   [] Shortness of breath at rest   [] Shortness of breath with exertion. Vascular:  [] Pain in legs with walking   [] Pain in legs at rest   [] Pain in legs when laying flat   [] Claudication   [] Pain in feet when walking  [] Pain in feet at rest  [] Pain in feet when laying flat   [] History of DVT   [] Phlebitis   [] Swelling in legs   [] Varicose veins   [] Non-healing ulcers Pulmonary:   [] Uses home oxygen   [] Productive cough   [] Hemoptysis   [] Wheeze  [] COPD   [] Asthma Neurologic:  [x] Dizziness  [] Blackouts   [] Seizures   [] History of stroke   [] History of TIA  [] Aphasia   [] Temporary blindness   [] Dysphagia   [] Weakness or numbness in  arms   [] Weakness or numbness in legs Musculoskeletal:  [x] Arthritis   [] Joint swelling   [] Joint pain   [] Low back pain Hematologic:  [] Easy bruising  [] Easy bleeding   [] Hypercoagulable state   [x] Anemic  [] Hepatitis Gastrointestinal:  [] Blood in stool   [] Vomiting blood  [] Gastroesophageal reflux/heartburn   [] Difficulty swallowing. Genitourinary:  [] Chronic kidney disease   [] Difficult  urination  [] Frequent urination  [] Burning with urination   [] Blood in urine Skin:  [] Rashes   [] Ulcers   [] Wounds Psychological:  [] History of anxiety   []  History of major depression.  Physical Examination  Vitals:   09/10/23 1032  BP: 115/78  Pulse: 73  Weight: 157 lb 9.6 oz (71.5 kg)  Height: 5' 3 (1.6 m)   Body mass index is 27.92 kg/m. Gen:  WD/WN, NAD. Appears younger than stated age. Head: [place]/AT, No temporalis wasting. Ear/Nose/Throat: Hearing grossly intact, nares w/o erythema or drainage, trachea midline Eyes: Conjunctiva clear. Sclera non-icteric Neck: Supple.  No bruit  Pulmonary:  Good air movement, equal and clear to auscultation bilaterally.  Cardiac: RRR, No JVD Vascular:  Vessel Right Left  Radial Palpable Palpable           Musculoskeletal: M/S 5/5 throughout.  No deformity or atrophy. No edema. Neurologic: CN 2-12 intact. Sensation grossly intact in extremities.  Symmetrical.  Speech is fluent. Motor exam as listed above. Psychiatric: Judgment intact, Mood & affect appropriate for pt's clinical situation. Dermatologic: No rashes or ulcers noted.  No cellulitis or open wounds.     CBC Lab Results  Component Value Date   WBC 5.2 06/18/2023   HGB 13.5 06/18/2023   HCT 39.6 06/18/2023   MCV 90.4 06/18/2023   PLT 217 06/18/2023    BMET    Component Value Date/Time   NA 139 06/18/2023 0645   NA 139 10/27/2012 1816   K 3.2 (L) 06/18/2023 0645   K 3.6 10/27/2012 1816   CL 101 06/18/2023 0645   CL 107 10/27/2012 1816   CO2 26 06/18/2023 0645   CO2 22 10/27/2012  1816   GLUCOSE 96 06/18/2023 0645   GLUCOSE 99 10/27/2012 1816   BUN 25 (H) 06/18/2023 0645   BUN 20 (H) 10/27/2012 1816   CREATININE 0.85 06/18/2023 0645   CREATININE 0.72 02/10/2023 0947   CREATININE 0.70 10/27/2012 1816   CALCIUM 9.4 06/18/2023 0645   CALCIUM 9.3 10/27/2012 1816   GFRNONAA >60 06/18/2023 0645   GFRNONAA >60 02/10/2023 0947   GFRNONAA >60 10/27/2012 1816   GFRAA >60 01/12/2018 0855   GFRAA >60 10/27/2012 1816   CrCl cannot be calculated (Patient's most recent lab result is older than the maximum 21 days allowed.).  COAG Lab Results  Component Value Date   INR 1.0 06/18/2023   INR 0.90 01/12/2018   INR 0.9 10/27/2012    Radiology No results found.   Assessment/Plan Carotid stenosis Her carotid duplex today shows minimal carotid disease in the lower end of the 1-39%. No changes. Recheck in one year. Continue aspirin and statin agent.  Essential hypertension blood pressure control important in reducing the progression of atherosclerotic disease. On appropriate oral medications.     Mixed hyperlipidemia lipid control important in reducing the progression of atherosclerotic disease. Continue statin therapy  Selinda Gu, MD  09/10/2023 11:08 AM    This note was created with Dragon medical transcription system.  Any errors from dictation are purely unintentional

## 2023-09-16 ENCOUNTER — Ambulatory Visit
Admission: RE | Admit: 2023-09-16 | Discharge: 2023-09-16 | Disposition: A | Discharge status: Home / Self Care | Source: Ambulatory Visit | Attending: Oncology | Admitting: Oncology

## 2023-09-16 DIAGNOSIS — Z1231 Encounter for screening mammogram for malignant neoplasm of breast: Secondary | ICD-10-CM | POA: Insufficient documentation

## 2023-09-16 DIAGNOSIS — Z86000 Personal history of in-situ neoplasm of breast: Secondary | ICD-10-CM | POA: Diagnosis present

## 2023-11-27 ENCOUNTER — Other Ambulatory Visit: Payer: Self-pay | Admitting: Oncology

## 2023-12-13 ENCOUNTER — Encounter: Payer: Self-pay | Admitting: Oncology

## 2023-12-13 ENCOUNTER — Inpatient Hospital Stay: Attending: Oncology | Admitting: Oncology

## 2023-12-13 VITALS — BP 139/80 | HR 98 | Temp 97.6°F | Resp 20 | Wt 146.4 lb

## 2023-12-13 DIAGNOSIS — Z79899 Other long term (current) drug therapy: Secondary | ICD-10-CM | POA: Diagnosis not present

## 2023-12-13 DIAGNOSIS — Z08 Encounter for follow-up examination after completed treatment for malignant neoplasm: Secondary | ICD-10-CM | POA: Diagnosis not present

## 2023-12-13 DIAGNOSIS — Z79811 Long term (current) use of aromatase inhibitors: Secondary | ICD-10-CM | POA: Diagnosis not present

## 2023-12-13 DIAGNOSIS — Z86000 Personal history of in-situ neoplasm of breast: Secondary | ICD-10-CM

## 2023-12-13 NOTE — Progress Notes (Signed)
 Hematology/Oncology Consult note [facility]  [phone] [phone]  Patient Care Team: Alla Amis, MD as PCP - General (Family Medicine) Georgina Shasta POUR, RN as Oncology Nurse Navigator Lenn Aran, MD as Consulting Physician (Radiation Oncology) Rodolph Romano, MD as Consulting Physician (General Surgery) Melanee Annah BROCKS, MD as Consulting Physician (Oncology)   Name of the patient: Lori Kidd  [id number]  15-Jan-1951   Date of visit: 12/13/23  Diagnosis-right breast DCIS ER positive  Chief complaint/ Reason for visit-routine follow-up of DCIS presently on letrozole   Heme/Onc history: Patient is a 73 year old female who underwent a routine screening mammogram in August 2024 which showed possible calcifications in the right breast. This was followed by diagnostic mammogram which showed 0.9 cm group of pleomorphic calcifications in the right breast outer quadrant. 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    Patient underwent lumpectomy on 10/07/2022.  Final pathology showed 9 mm ER positive DCIS intermediate grade with comedonecrosis and negative margins of 3 mm.  She completed adjuvant radiation therapy and started taking letrozole  in November 2024.  Baseline bone density scan normal  Interval history-  Lori Kidd is a 73 year old female with ductal carcinoma in situ (DCIS) who presents with memory issues.  She was last seen in May for DCIS and at that time experienced memory issues. She temporarily stopped taking letrozole  for four to five days to assess its impact on her memory but resumed it due to her perceived need for the medication.  Subsequently, she discovered a significant vitamin B12 deficiency, which was addressed with B12 injections. She described the treatment as 'life changing,' noting a  marked improvement in her memory and cognitive function. Her initial B12 level was 246, which is considered low, and after treatment, her levels normalized to 525. She has since transitioned from injections to daily B12 capsules.  She continues to take letrozole  and reports that she is managing well with the medication. Her mammogram in August was normal, and she is scheduled for her next mammogram in August of the following year.      ECOG PS- 1 Pain scale- 0   Review of systems- Review of Systems  Constitutional:  Negative for chills, fever, malaise/fatigue and weight loss.  HENT:  Negative for congestion, ear discharge and nosebleeds.   Eyes:  Negative for blurred vision.  Respiratory:  Negative for cough, hemoptysis, sputum production, shortness of breath and wheezing.   Cardiovascular:  Negative for chest pain, palpitations, orthopnea and claudication.  Gastrointestinal:  Negative for abdominal pain, blood in stool, constipation, diarrhea, heartburn, melena, nausea and vomiting.  Genitourinary:  Negative for dysuria, flank pain, frequency, hematuria and urgency.  Musculoskeletal:  Negative for back pain, joint pain and myalgias.  Skin:  Negative for rash.  Neurological:  Negative for dizziness, tingling, focal weakness, seizures, weakness and headaches.  Endo/Heme/Allergies:  Does not bruise/bleed easily.  Psychiatric/Behavioral:  Negative for depression and suicidal ideas. The patient does not have insomnia.       Allergies  Allergen Reactions   Ace Inhibitors Cough   Penicillins Rash and Other (See Comments)    Redness Has patient had a PCN reaction causing immediate rash, facial/tongue/throat swelling, SOB or lightheadedness with hypotension: Yes Has patient had a PCN reaction causing severe rash involving mucus membranes or skin necrosis: No Has patient had a PCN reaction that required hospitalization: No Has patient  had a PCN reaction occurring within the last 10 years:  No If all of the above answers are NO, then may proceed with Cephalosporin use.      Past Medical History:  Diagnosis Date   Arthritis    Asthma    Cancer (HCC)    Carotid stenosis    High blood pressure    High cholesterol    Hypothyroidism    Meniere disease    PAD (peripheral artery disease)    Pneumonia    S/P TKR (total knee replacement), right      Past Surgical History:  Procedure Laterality Date   APPENDECTOMY  1982   BREAST BIOPSY Right 09/22/2022   stereo bx, x-clip, path pending   BREAST BIOPSY Right 09/22/2022   MM RT BREAST BX W LOC DEV 1ST LESION IMAGE BX SPEC STEREO GUIDE 09/22/2022 [facility]   BREAST BIOPSY Right 10/01/2022   MM RT RADIO FREQUENCY TAG LOC MAMMO GUIDE 10/01/2022 [facility]   CATARACT EXTRACTION W/PHACO Right 03/17/2021   Procedure: CATARACT EXTRACTION PHACO AND INTRAOCULAR LENS PLACEMENT (IOC) RIGHT;  Surgeon: Myrna Adine Anes, MD;  Location: [facility];  Service: Ophthalmology;  Laterality: Right;  4.96 00:35.7   COLONOSCOPY     Q5YRS SINCE 1998   COLONOSCOPY WITH PROPOFOL  N/A 01/29/2021   Procedure: COLONOSCOPY WITH PROPOFOL ;  Surgeon: Toledo, Ladell POUR, MD;  Location: [facility];  Service: Gastroenterology;  Laterality: N/A;   KNEE ARTHROPLASTY Right 01/24/2018   Procedure: COMPUTER ASSISTED TOTAL KNEE ARTHROPLASTY;  Surgeon: Mardee Lynwood SQUIBB, MD;  Location: [facility];  Service: Orthopedics;  Laterality: Right;   SIGMOID RESECTION / RECTOPEXY  1998   TONSILLECTOMY  1962   TUBAL LIGATION  1983   VAGINAL HYSTERECTOMY  2000    Social History   Socioeconomic History   Marital status: Married    Spouse name: Garrel   Number of children: Not on file   Years of education: Not on file   Highest education level: Not on file  Occupational History   Occupation: Retired Runner, Broadcasting/film/video  Tobacco Use   Smoking status: Former    Types: Cigarettes   Smokeless tobacco: Never  Vaping Use   Vaping status: Never Used  Substance  and Sexual Activity   Alcohol use: Yes    Alcohol/week: 3.0 standard drinks of alcohol    Types: 3 Cans of beer per week    Comment: NONE LAST 24HRS   Drug use: Never   Sexual activity: Not on file  Other Topics Concern   Not on file  Social History Narrative   Not on file   Social Drivers of Health   Financial Resource Strain: Low Risk  (07/07/2023)   Received from [facility]   Overall Financial Resource Strain (CARDIA)    Difficulty of Paying Living Expenses: Not hard at all  Food Insecurity: No Food Insecurity (07/07/2023)   Received from [facility]   Hunger Vital Sign    Within the past 12 months, you worried that your food would run out before you got the money to buy more.: Never true    Within the past 12 months, the food you bought just didn't last and you didn't have money to get more.: Never true  Transportation Needs: No Transportation Needs (07/07/2023)   Received from [facility] - Transportation    In the past 12 months, has lack of transportation kept you from medical appointments or from getting medications?: No  Lack of Transportation (Non-Medical): No  Physical Activity: Not on file  Stress: Not on file  Social Connections: Not on file  Intimate Partner Violence: Not At Risk (09/29/2022)   Humiliation, Afraid, Rape, and Kick questionnaire    Fear of Current or Ex-Partner: No    Emotionally Abused: No    Physically Abused: No    Sexually Abused: No    Family History  Problem Relation Age of Onset   Breast cancer Sister        early 52's   Pneumonia Mother    Pneumonia Father      Current Outpatient Medications:    Acetaminophen  500 MG capsule, Take by mouth every 6 (six) hours as needed for fever., Disp: , Rfl:    albuterol  (PROVENTIL  HFA;VENTOLIN  HFA) 108 (90 Base) MCG/ACT inhaler, Inhale 2 puffs into the lungs every 6 (six) hours as needed for wheezing or shortness of breath., Disp: ,  Rfl:    aspirin EC 81 MG tablet, Take 81 mg by mouth daily. Swallow whole., Disp: , Rfl:    Cholecalciferol 25 MCG (1000 UT) capsule, Take by mouth., Disp: , Rfl:    cyanocobalamin (VITAMIN B12) 1000 MCG tablet, Take 1,000 mcg by mouth daily., Disp: , Rfl:    EPINEPHrine  0.3 mg/0.3 mL IJ SOAJ injection, Inject 0.3 mg into the muscle once., Disp: , Rfl:    fluticasone  (FLONASE ) 50 MCG/ACT nasal spray, Place 1 spray into both nostrils daily., Disp: , Rfl:    letrozole  (FEMARA ) 2.5 MG tablet, TAKE 1 TABLET BY MOUTH EVERY DAY, Disp: 90 tablet, Rfl: 1   levothyroxine  (SYNTHROID , LEVOTHROID) 112 MCG tablet, Take 112 mcg by mouth daily before breakfast. , Disp: , Rfl:    loratadine  (CLARITIN ) 10 MG tablet, Take 10 mg by mouth daily as needed for allergies., Disp: , Rfl:    meclizine (ANTIVERT) 25 MG tablet, Take 25 mg by mouth 3 (three) times daily as needed for dizziness., Disp: , Rfl:    naproxen sodium (ALEVE) 220 MG tablet, Take 220 mg by mouth daily as needed., Disp: , Rfl:    Omega-3 1000 MG CAPS, Take 2,000 mg by mouth daily. , Disp: , Rfl:    pravastatin  (PRAVACHOL ) 40 MG tablet, Take 1 tablet by mouth at bedtime., Disp: , Rfl:    tretinoin (RETIN-A) 0.025 % cream, SMARTSIG:sparingly Topical Every Evening, Disp: , Rfl:    triamterene -hydrochlorothiazide  (MAXZIDE -25) 37.5-25 MG tablet, Take 1 tablet by mouth daily., Disp: , Rfl:   Physical exam:  Vitals:   12/13/23 0952  BP: 139/80  Pulse: 98  Resp: 20  Temp: 97.6 F (36.4 C)  SpO2: 100%  Weight: 146 lb 6.4 oz (66.4 kg)   Physical Exam Cardiovascular:     Rate and Rhythm: Normal rate and regular rhythm.     Heart sounds: Normal heart sounds.  Pulmonary:     Effort: Pulmonary effort is normal.     Breath sounds: Normal breath sounds.  Skin:    General: Skin is warm and dry.  Neurological:     Mental Status: She is alert and oriented to person, place, and time.    Breast exam was performed in seated and lying down  position. Patient is status post right lumpectomy with a well-healed surgical scar. No evidence of any palpable masses. No evidence of axillary adenopathy. No evidence of any palpable masses or lumps in the left breast. No evidence of leftt axillary adenopathy   I have personally reviewed labs listed below:  Latest Ref Rng & Units 06/18/2023    6:45 AM  CMP  Glucose 70 - 99 mg/dL 96   BUN 8 - 23 mg/dL 25   Creatinine 9.55 - 1.00 mg/dL 9.14   Sodium 864 - 854 mmol/L 139   Potassium 3.5 - 5.1 mmol/L 3.2   Chloride 98 - 111 mmol/L 101   CO2 22 - 32 mmol/L 26   Calcium 8.9 - 10.3 mg/dL 9.4   Total Protein 6.5 - 8.1 g/dL 6.6   Total Bilirubin 0.0 - 1.2 mg/dL 0.9   Alkaline Phos 38 - 126 U/L 61   AST 15 - 41 U/L 24   ALT 0 - 44 U/L 16       Latest Ref Rng & Units 06/18/2023    6:45 AM  CBC  WBC 4.0 - 10.5 K/uL 5.2   Hemoglobin 12.0 - 15.0 g/dL 86.4   Hematocrit 63.9 - 46.0 % 39.6   Platelets 150 - 400 K/uL 217      Assessment and plan- Patient is a 73 y.o. 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 Memory issues initially attributed to letrozole  were found to be due to Vitamin B12 deficiency. Symptoms improved with B12 supplementation. Letrozole  is well-tolerated and will continue for five years. Recent mammogram was normal. - Continue letrozole  therapy for a total of five years. - Schedule next mammogram for August next year.  Vitamin B12 deficiency, status post treatment Initial B12 level was low at 246. Treated with injections, now on oral capsules. Levels normalized to 525 with symptom improvement. - Continue daily oral B12 supplementation.         Visit Diagnosis 1. Encounter for follow-up surveillance of ductal carcinoma in situ  (DCIS) of breast   2. High risk medication use   3. Use of letrozole  (Femara )      Dr. Annah Skene, MD, MPH [facility] [phone] 12/13/2023 12:49 PM

## 2023-12-13 NOTE — Progress Notes (Signed)
 Patient states her husband is concerned about the letrozole .Patient isn't concerned.

## 2023-12-17 ENCOUNTER — Ambulatory Visit: Admitting: Oncology

## 2024-01-06 ENCOUNTER — Ambulatory Visit: Admitting: Radiation Oncology

## 2024-02-28 ENCOUNTER — Encounter: Payer: Self-pay | Admitting: Oncology

## 2024-02-28 DIAGNOSIS — D0511 Intraductal carcinoma in situ of right breast: Secondary | ICD-10-CM

## 2024-02-29 NOTE — Telephone Encounter (Signed)
 Please ask her if she wants to try aromasin 

## 2024-03-03 MED ORDER — EXEMESTANE 25 MG PO TABS: 25.0000 mg | ORAL_TABLET | Freq: Every day | ORAL | 3 refills | Status: AC

## 2024-06-14 ENCOUNTER — Inpatient Hospital Stay: Admitting: Oncology

## 2024-09-13 ENCOUNTER — Encounter (INDEPENDENT_AMBULATORY_CARE_PROVIDER_SITE_OTHER)

## 2024-09-13 ENCOUNTER — Ambulatory Visit (INDEPENDENT_AMBULATORY_CARE_PROVIDER_SITE_OTHER): Admitting: Nurse Practitioner
